# Patient Record
Sex: Female | Born: 1975 | Race: Black or African American | Hispanic: Refuse to answer | Marital: Single
Health system: Midwestern US, Community
[De-identification: ages and names within clinical notes are randomized; demographics above are authoritative.]

## PROBLEM LIST (undated history)

## (undated) DIAGNOSIS — R059 Cough, unspecified: Secondary | ICD-10-CM

## (undated) DIAGNOSIS — J45909 Unspecified asthma, uncomplicated: Secondary | ICD-10-CM

## (undated) DIAGNOSIS — K219 Gastro-esophageal reflux disease without esophagitis: Secondary | ICD-10-CM

## (undated) DIAGNOSIS — F329 Major depressive disorder, single episode, unspecified: Secondary | ICD-10-CM

## (undated) DIAGNOSIS — I890 Lymphedema, not elsewhere classified: Secondary | ICD-10-CM

## (undated) DIAGNOSIS — F909 Attention-deficit hyperactivity disorder, unspecified type: Secondary | ICD-10-CM

## (undated) DIAGNOSIS — D649 Anemia, unspecified: Secondary | ICD-10-CM

## (undated) DIAGNOSIS — E538 Deficiency of other specified B group vitamins: Secondary | ICD-10-CM

## (undated) DIAGNOSIS — F32A Depression, unspecified: Secondary | ICD-10-CM

## (undated) DIAGNOSIS — E739 Lactose intolerance, unspecified: Secondary | ICD-10-CM

## (undated) DIAGNOSIS — E559 Vitamin D deficiency, unspecified: Secondary | ICD-10-CM

## (undated) DIAGNOSIS — Z91018 Allergy to other foods: Secondary | ICD-10-CM

## (undated) DIAGNOSIS — G4730 Sleep apnea, unspecified: Secondary | ICD-10-CM

## (undated) DIAGNOSIS — F419 Anxiety disorder, unspecified: Secondary | ICD-10-CM

## (undated) DIAGNOSIS — L309 Dermatitis, unspecified: Secondary | ICD-10-CM

## (undated) DIAGNOSIS — N159 Renal tubulo-interstitial disease, unspecified: Secondary | ICD-10-CM

## (undated) DIAGNOSIS — M2550 Pain in unspecified joint: Secondary | ICD-10-CM

## (undated) HISTORY — DX: Anemia, unspecified: D64.9

## (undated) HISTORY — PX: TONSILLECTOMY: SUR1361

## (undated) HISTORY — DX: Sleep apnea, unspecified: G47.30

## (undated) HISTORY — DX: Allergy to other foods: Z91.018

## (undated) HISTORY — DX: Vitamin D deficiency, unspecified: E55.9

## (undated) HISTORY — DX: Deficiency of other specified B group vitamins: E53.8

## (undated) HISTORY — DX: Anxiety disorder, unspecified: F41.9

## (undated) HISTORY — DX: Major depressive disorder, single episode, unspecified: F32.9

## (undated) HISTORY — DX: Unspecified asthma, uncomplicated: J45.909

## (undated) HISTORY — DX: Depression, unspecified: F32.A

## (undated) HISTORY — DX: Lactose intolerance, unspecified: E73.9

## (undated) HISTORY — DX: Pain in unspecified joint: M25.50

## (undated) HISTORY — DX: Attention-deficit hyperactivity disorder, unspecified type: F90.9

## (undated) HISTORY — DX: Dermatitis, unspecified: L30.9

## (undated) HISTORY — DX: Lymphedema, not elsewhere classified: I89.0

## (undated) HISTORY — DX: Gastro-esophageal reflux disease without esophagitis: K21.9

---

## 1997-09-09 ENCOUNTER — Emergency Department (HOSPITAL_COMMUNITY): Admission: EM | Admit: 1997-09-09 | Discharge: 1997-09-09 | Payer: Self-pay | Admitting: Emergency Medicine

## 1997-11-03 ENCOUNTER — Emergency Department (HOSPITAL_COMMUNITY): Admission: EM | Admit: 1997-11-03 | Discharge: 1997-11-03 | Payer: Self-pay | Admitting: Internal Medicine

## 1998-04-22 ENCOUNTER — Other Ambulatory Visit: Admission: RE | Admit: 1998-04-22 | Discharge: 1998-04-22 | Payer: Self-pay | Admitting: *Deleted

## 1999-01-02 ENCOUNTER — Emergency Department (HOSPITAL_COMMUNITY): Admission: EM | Admit: 1999-01-02 | Discharge: 1999-01-02 | Payer: Self-pay | Admitting: Emergency Medicine

## 1999-01-03 ENCOUNTER — Inpatient Hospital Stay (HOSPITAL_COMMUNITY): Admission: AD | Admit: 1999-01-03 | Discharge: 1999-01-03 | Payer: Self-pay | Admitting: Obstetrics and Gynecology

## 2001-04-09 ENCOUNTER — Emergency Department (HOSPITAL_COMMUNITY): Admission: EM | Admit: 2001-04-09 | Discharge: 2001-04-09 | Payer: Self-pay | Admitting: Emergency Medicine

## 2001-04-21 ENCOUNTER — Ambulatory Visit (HOSPITAL_BASED_OUTPATIENT_CLINIC_OR_DEPARTMENT_OTHER): Admission: RE | Admit: 2001-04-21 | Discharge: 2001-04-21 | Payer: Self-pay | Admitting: Pediatrics

## 2007-08-27 ENCOUNTER — Ambulatory Visit: Payer: Self-pay | Admitting: *Deleted

## 2007-08-27 ENCOUNTER — Inpatient Hospital Stay (HOSPITAL_COMMUNITY): Admission: AD | Admit: 2007-08-27 | Discharge: 2007-08-27 | Payer: Self-pay | Admitting: Obstetrics & Gynecology

## 2007-09-07 ENCOUNTER — Ambulatory Visit: Payer: Self-pay | Admitting: Obstetrics & Gynecology

## 2007-09-21 ENCOUNTER — Ambulatory Visit: Payer: Self-pay | Admitting: Obstetrics & Gynecology

## 2007-10-05 ENCOUNTER — Ambulatory Visit: Payer: Self-pay | Admitting: Obstetrics & Gynecology

## 2007-10-12 ENCOUNTER — Ambulatory Visit: Payer: Self-pay | Admitting: Obstetrics & Gynecology

## 2007-10-18 ENCOUNTER — Inpatient Hospital Stay (HOSPITAL_COMMUNITY): Admission: AD | Admit: 2007-10-18 | Discharge: 2007-10-18 | Payer: Self-pay | Admitting: Obstetrics & Gynecology

## 2007-10-18 ENCOUNTER — Ambulatory Visit: Payer: Self-pay | Admitting: Advanced Practice Midwife

## 2007-10-26 ENCOUNTER — Ambulatory Visit: Payer: Self-pay | Admitting: Obstetrics & Gynecology

## 2007-10-29 ENCOUNTER — Inpatient Hospital Stay (HOSPITAL_COMMUNITY): Admission: AD | Admit: 2007-10-29 | Discharge: 2007-10-31 | Payer: Self-pay | Admitting: Obstetrics and Gynecology

## 2007-10-29 ENCOUNTER — Ambulatory Visit: Payer: Self-pay | Admitting: Obstetrics & Gynecology

## 2007-12-28 ENCOUNTER — Other Ambulatory Visit: Admission: RE | Admit: 2007-12-28 | Discharge: 2007-12-28 | Payer: Self-pay | Admitting: Family Medicine

## 2009-01-16 ENCOUNTER — Emergency Department (HOSPITAL_COMMUNITY): Admission: EM | Admit: 2009-01-16 | Discharge: 2009-01-16 | Payer: Self-pay | Admitting: Emergency Medicine

## 2009-01-18 ENCOUNTER — Ambulatory Visit (HOSPITAL_COMMUNITY): Admission: AD | Admit: 2009-01-18 | Discharge: 2009-01-18 | Payer: Self-pay | Admitting: Obstetrics and Gynecology

## 2009-11-16 IMAGING — US US OB COMP +14 WK
1 series · 14 of 27 positions shown · non-contrast
Comparison: none

OBSTETRICAL ULTRASOUND:
 This ultrasound exam was performed in the [HOSPITAL] Ultrasound Department.  The OB US report was generated in the AS system, and faxed to the ordering physician.  This report is also available in [REDACTED] PACS.

[Series 1: us ob comp +14 wk · 14 of 27 slices shown]
[im 1/27]
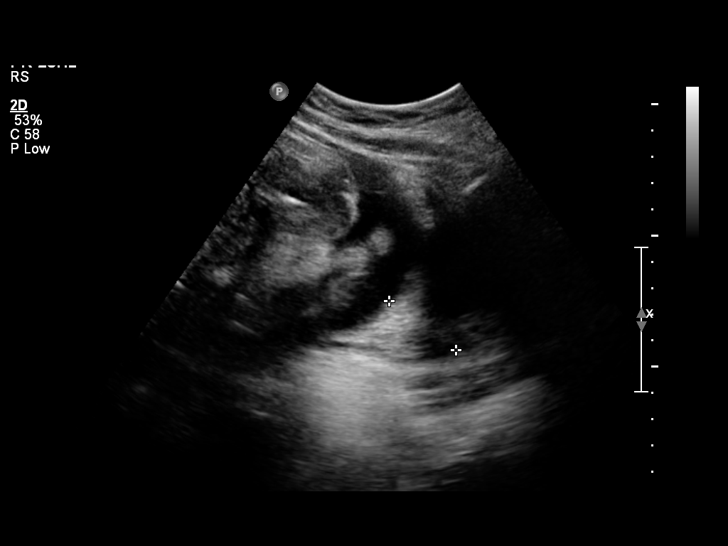
[im 3/27]
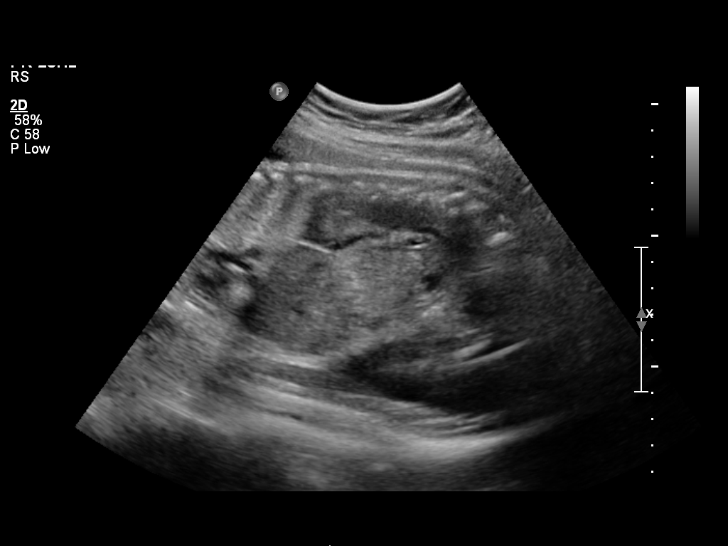
[im 5/27]
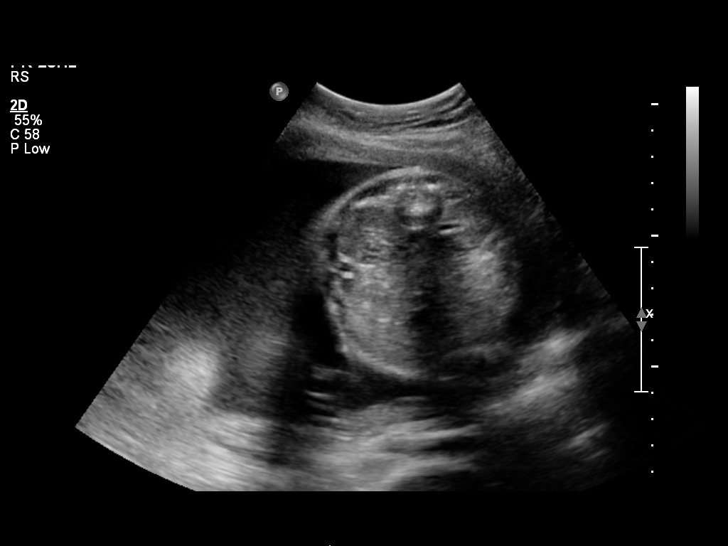
[im 7/27]
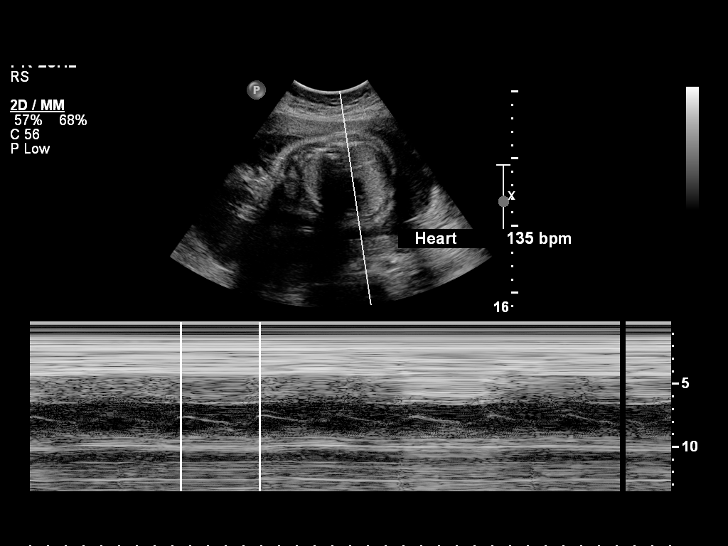
[im 9/27]
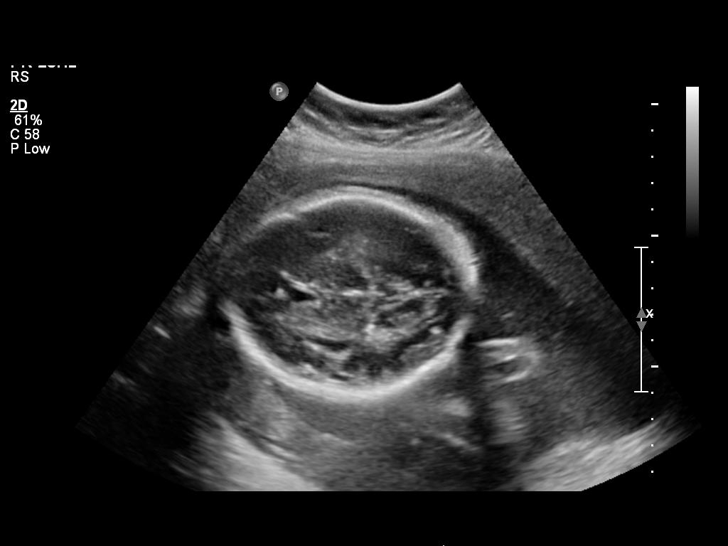
[im 11/27]
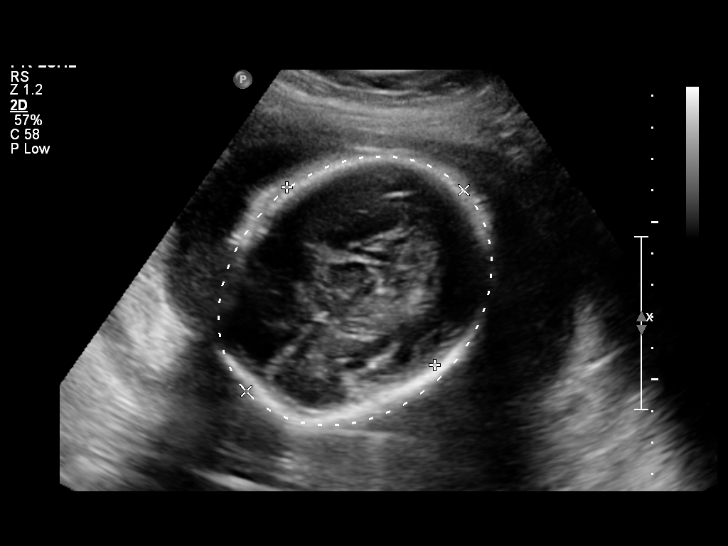
[im 13/27]
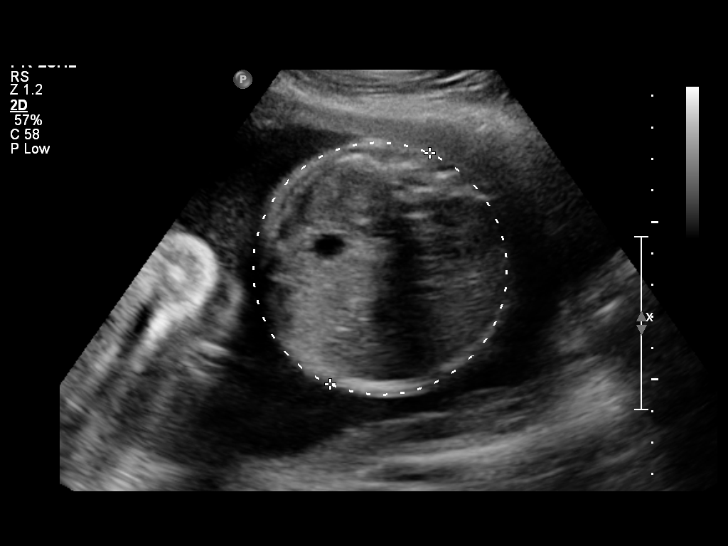
[im 15/27]
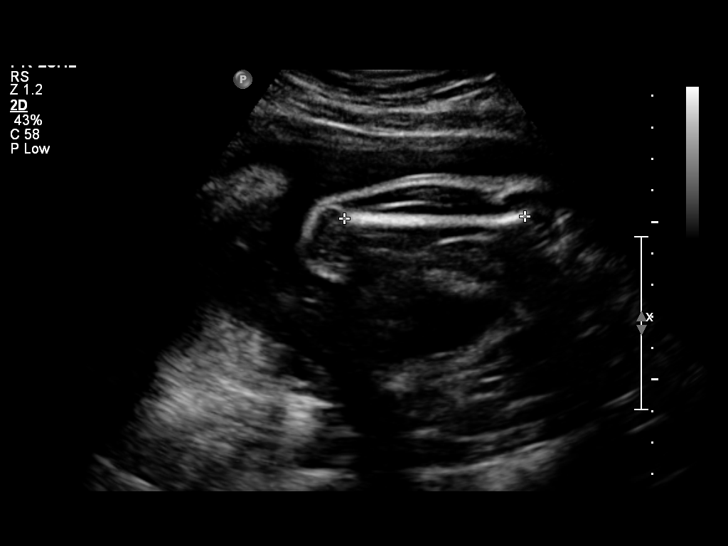
[im 17/27]
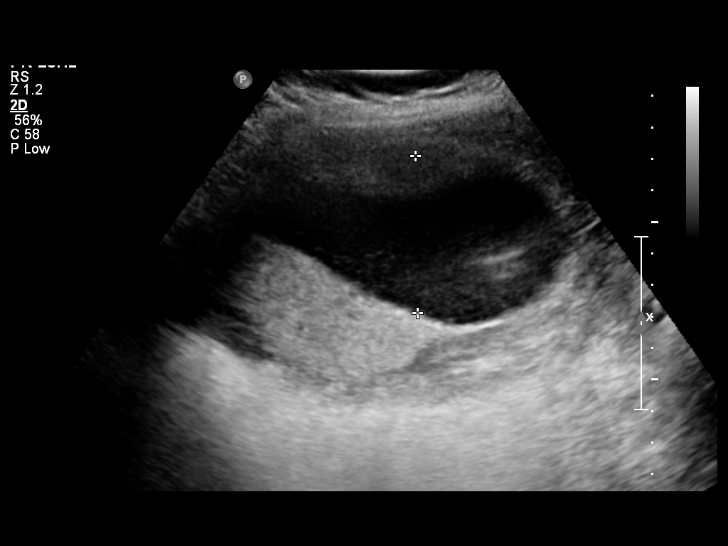
[im 19/27]
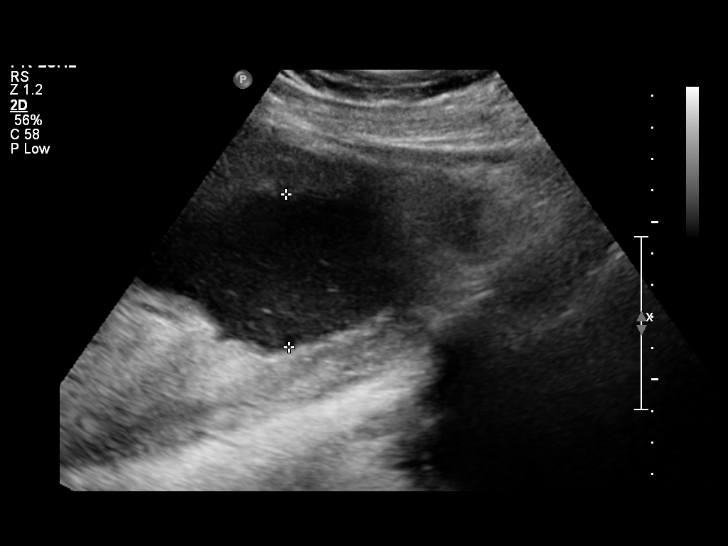
[im 21/27]
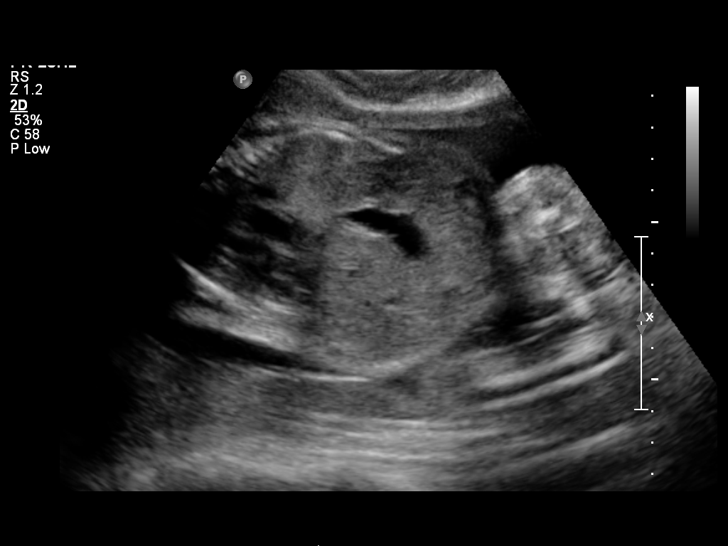
[im 23/27]
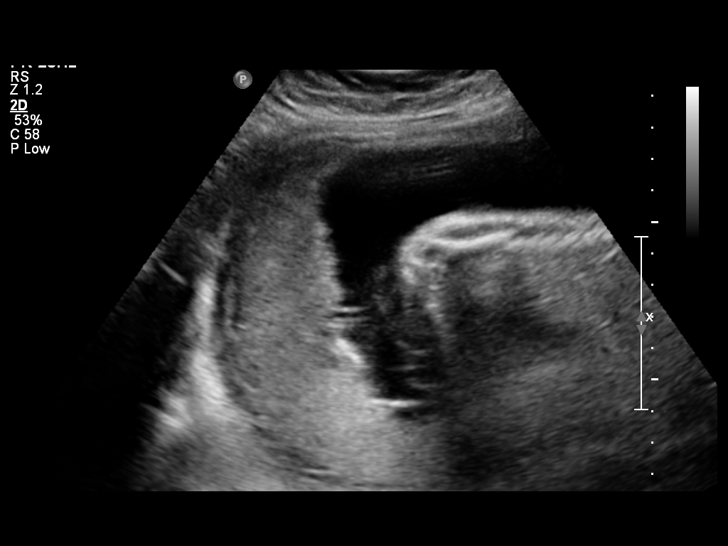
[im 25/27]
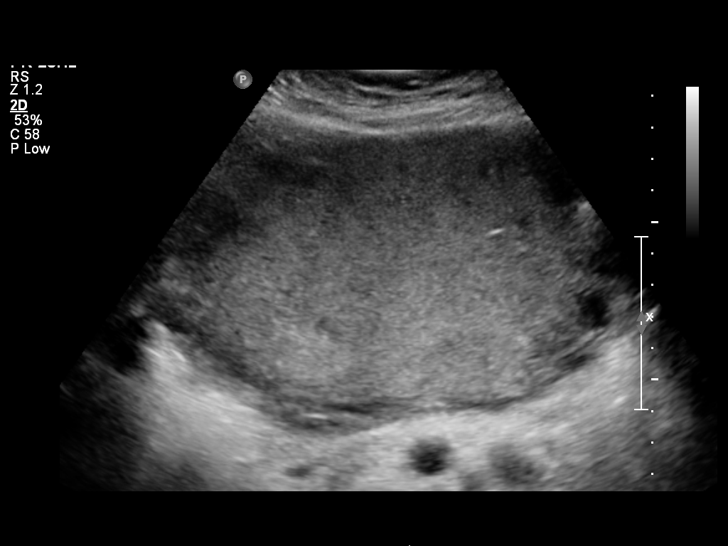
[im 27/27]
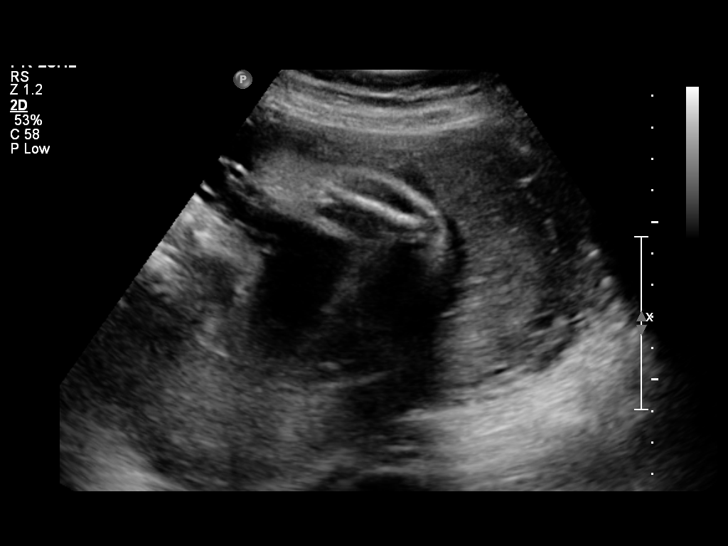

[14 of 27 positions shown; findings below may reference images not displayed]

IMPRESSION: See AS Obstetric US report.

## 2010-05-20 LAB — PROTIME-INR: Prothrombin Time: 13.9 seconds (ref 11.6–15.2)

## 2010-05-20 LAB — DIFFERENTIAL
Lymphocytes Relative: 11 % — ABNORMAL LOW (ref 12–46)
Monocytes Absolute: 0.4 10*3/uL (ref 0.1–1.0)
Monocytes Relative: 3 % (ref 3–12)
Neutro Abs: 10.5 10*3/uL — ABNORMAL HIGH (ref 1.7–7.7)

## 2010-05-20 LAB — TYPE AND SCREEN: ABO/RH(D): A POS

## 2010-05-20 LAB — COMPREHENSIVE METABOLIC PANEL
ALT: 15 U/L (ref 0–35)
Alkaline Phosphatase: 70 U/L (ref 39–117)
CO2: 26 mEq/L (ref 19–32)
Chloride: 105 mEq/L (ref 96–112)
GFR calc non Af Amer: >60 mL/min (ref >60)
Glucose, Bld: 107 mg/dL — ABNORMAL HIGH (ref 70–99)
Potassium: 3.9 mEq/L (ref 3.5–5.1)
Sodium: 136 mEq/L (ref 135–145)

## 2010-05-20 LAB — CBC
HCT: 36.4 % (ref 36.0–46.0)
Hemoglobin: 12.2 g/dL (ref 12.0–15.0)
Platelets: 332 10*3/uL (ref 150–400)
RBC: 4.43 MIL/uL (ref 3.87–5.11)
RBC: 4.59 MIL/uL (ref 3.87–5.11)
WBC: 7.1 10*3/uL (ref 4.0–10.5)

## 2010-05-20 LAB — CROSSMATCH: ABO/RH(D): A POS

## 2010-05-20 LAB — ABO/RH: ABO/RH(D): A POS

## 2010-11-13 LAB — URINALYSIS, ROUTINE W REFLEX MICROSCOPIC
Glucose, UA: NEGATIVE
Protein, ur: NEGATIVE
pH: 7

## 2010-11-13 LAB — GC/CHLAMYDIA PROBE AMP, GENITAL: GC Probe Amp, Genital: NEGATIVE

## 2010-11-13 LAB — WET PREP, GENITAL: Trich, Wet Prep: NONE SEEN

## 2010-11-14 LAB — POCT URINALYSIS DIP (DEVICE)
Bilirubin Urine: NEGATIVE
Glucose, UA: NEGATIVE
Glucose, UA: NEGATIVE
Hgb urine dipstick: NEGATIVE
Hgb urine dipstick: NEGATIVE
Ketones, ur: NEGATIVE
Specific Gravity, Urine: 1.015
Specific Gravity, Urine: 1.025
Urobilinogen, UA: 0.2

## 2010-11-19 LAB — POCT URINALYSIS DIP (DEVICE)
Glucose, UA: NEGATIVE
Nitrite: NEGATIVE
Operator id: 297281
Protein, ur: 30 — AB
Urobilinogen, UA: 0.2

## 2010-11-19 LAB — WET PREP, GENITAL
Clue Cells Wet Prep HPF POC: NONE SEEN
Trich, Wet Prep: NONE SEEN

## 2010-11-19 LAB — RPR: RPR Ser Ql: NONREACTIVE

## 2010-11-19 LAB — URINALYSIS, ROUTINE W REFLEX MICROSCOPIC
Ketones, ur: NEGATIVE
Nitrite: NEGATIVE
Specific Gravity, Urine: 1.015
Urobilinogen, UA: 0.2
pH: 8.5 — ABNORMAL HIGH

## 2010-11-19 LAB — URINE MICROSCOPIC-ADD ON

## 2010-11-19 LAB — CBC
Platelets: 275
RBC: 4.18
WBC: 6.9

## 2011-09-28 ENCOUNTER — Encounter (HOSPITAL_COMMUNITY): Payer: Self-pay | Admitting: *Deleted

## 2011-09-28 ENCOUNTER — Emergency Department (HOSPITAL_COMMUNITY)
Admission: EM | Admit: 2011-09-28 | Discharge: 2011-09-28 | Disposition: A | Discharge status: Home / Self Care | Payer: Self-pay | Attending: Emergency Medicine | Admitting: Emergency Medicine

## 2011-09-28 DIAGNOSIS — J029 Acute pharyngitis, unspecified: Secondary | ICD-10-CM | POA: Insufficient documentation

## 2011-09-28 DIAGNOSIS — N10 Acute tubulo-interstitial nephritis: Secondary | ICD-10-CM | POA: Insufficient documentation

## 2011-09-28 HISTORY — DX: Renal tubulo-interstitial disease, unspecified: N15.9

## 2011-09-28 LAB — CBC WITH DIFFERENTIAL/PLATELET
Eosinophils Absolute: 0 10*3/uL (ref 0.0–0.7)
Eosinophils Relative: 0 % (ref 0–5)
HCT: 38.7 % (ref 36.0–46.0)
Hemoglobin: 12.6 g/dL (ref 12.0–15.0)
Lymphocytes Relative: 9 % — ABNORMAL LOW (ref 12–46)
Lymphs Abs: 1.2 10*3/uL (ref 0.7–4.0)
MCHC: 32.6 g/dL (ref 30.0–36.0)
Monocytes Absolute: 0.9 10*3/uL (ref 0.1–1.0)
Neutro Abs: 11.3 10*3/uL — ABNORMAL HIGH (ref 1.7–7.7)
Neutrophils Relative %: 84 % — ABNORMAL HIGH (ref 43–77)
WBC: 13.4 10*3/uL — ABNORMAL HIGH (ref 4.0–10.5)

## 2011-09-28 LAB — POCT I-STAT, CHEM 8
Calcium, Ion: 1.1 mmol/L — ABNORMAL LOW (ref 1.12–1.23)
Creatinine, Ser: 0.9 mg/dL (ref 0.50–1.10)
Hemoglobin: 13.6 g/dL (ref 12.0–15.0)
Sodium: 136 mEq/L (ref 135–145)
TCO2: 19 mmol/L (ref 0–100)

## 2011-09-28 LAB — URINALYSIS, ROUTINE W REFLEX MICROSCOPIC
Ketones, ur: NEGATIVE mg/dL
Nitrite: NEGATIVE
Protein, ur: NEGATIVE mg/dL
pH: 6 (ref 5.0–8.0)

## 2011-09-28 LAB — RAPID STREP SCREEN (MED CTR MEBANE ONLY): Streptococcus, Group A Screen (Direct): NEGATIVE

## 2011-09-28 LAB — URINE MICROSCOPIC-ADD ON

## 2011-09-28 LAB — POCT PREGNANCY, URINE: Preg Test, Ur: NEGATIVE

## 2011-09-28 MED ORDER — CEPHALEXIN 500 MG PO CAPS: 500.0000 mg | ORAL_CAPSULE | Freq: Four times a day (QID) | ORAL | Status: DC

## 2011-09-28 MED ORDER — ONDANSETRON 4 MG PO TBDP: 4.0000 mg | ORAL_TABLET | Freq: Three times a day (TID) | ORAL | Status: DC | PRN

## 2011-09-28 MED ORDER — TRAMADOL HCL 50 MG PO TABS: 50.0000 mg | ORAL_TABLET | Freq: Four times a day (QID) | ORAL | Status: DC | PRN

## 2011-09-28 MED ORDER — MORPHINE SULFATE 4 MG/ML IJ SOLN
4.0000 mg | Freq: Once | INTRAMUSCULAR | Status: AC
  Administered 2011-09-28: 4 mg via INTRAVENOUS
  Filled 2011-09-28: qty 1

## 2011-09-28 MED ORDER — SODIUM CHLORIDE 0.9 % IV BOLUS (SEPSIS)
1000.0000 mL | Freq: Once | INTRAVENOUS | Status: AC
  Administered 2011-09-28: 1000 mL via INTRAVENOUS

## 2011-09-28 MED ORDER — ONDANSETRON HCL 4 MG/2ML IJ SOLN
4.0000 mg | INTRAMUSCULAR | Status: AC
  Administered 2011-09-28: 4 mg via INTRAVENOUS
  Filled 2011-09-28: qty 2

## 2011-09-28 NOTE — ED Notes (Signed)
Pt reports flank pain on her right side. Pain radiates up her back and occasionally shoots down her leg.  Reports pain as "achy." Pt has a hx of polynephritis and states her current Sx are similar.  Pt is having pain and burning sensation upon urination. Urine is yellow but cloudy.  Endorses nausea and vomited x1 this am. Currently rates pain a 8/10.

## 2011-09-28 NOTE — ED Notes (Signed)
PA at bedside.

## 2011-09-28 NOTE — ED Notes (Signed)
Pt c/o urinary frequency, low back pain and pelvic pain starting Sat. Pt states nausea and headache w/ fever starting Sunday.

## 2011-09-28 NOTE — ED Provider Notes (Signed)
History     CSN: [id number]  Arrival date & time 09/28/11  0507   First MD Initiated Contact with Patient 09/28/11 [phone]      Chief Complaint  Patient presents with  . Urinary Frequency  . Back Pain  . Pelvic Pain  . Fever    (Consider location/radiation/quality/duration/timing/severity/associated sxs/prior treatment) HPI  36 y/o female INAD c/o dysuria and frequency x2 weeks. She has been taking AZO with moderate relief. She now has bilateral flank pain a(8/10) and low back pain x3days, subjective fever/chills x1 day with single episode of vommiting this AM. Tolerating PO liquids. Pt also reports a sore throat x2 days and HA. States she has not eaten solids in 2 days because of the sore throat and nausea equally. Denies change in bowel habits.   Past Medical History  Diagnosis Date  . Kidney infection     History reviewed. No pertinent past surgical history.  History reviewed. No pertinent family history.  History  Substance Use Topics  . Smoking status: Never Smoker   . Smokeless tobacco: Not on file  . Alcohol Use: No    OB History    Grav Para Term Preterm Abortions TAB SAB Ect Mult Living                  Review of Systems  Constitutional: Positive for fever.  HENT: Positive for sore throat.   Gastrointestinal: Positive for nausea and vomiting. Negative for abdominal pain and diarrhea.  Genitourinary: Positive for dysuria, frequency and flank pain.  Musculoskeletal: Positive for back pain.  All other systems reviewed and are negative.    Allergies  Review of patient's allergies indicates no known allergies.  Home Medications  No current outpatient prescriptions on file.  BP 111/51  Pulse 113  Temp 99.3 F (37.4 C) (Oral)  Resp 20  SpO2 99%  Physical Exam  Nursing note and vitals reviewed. Constitutional: She is oriented to person, place, and time. She appears well-developed and well-nourished. No distress.  HENT:  Head: Normocephalic.  Right  Ear: External ear normal.  Left Ear: External ear normal.  Mouth/Throat: Oropharynx is clear and moist. No oropharyngeal exudate.  Eyes: Conjunctivae and EOM are normal. Pupils are equal, round, and reactive to light.  Neck: Normal range of motion. Neck supple.       Tender anterior cervical LAD  Cardiovascular: Normal rate and regular rhythm.   Pulmonary/Chest: Effort normal and breath sounds normal. No respiratory distress. She has no wheezes. She has no rales.  Abdominal: Soft. Bowel sounds are normal. She exhibits no distension and no mass. There is no tenderness. There is no rebound and no guarding.  Genitourinary:       Bilateral flank pain  Musculoskeletal: Normal range of motion.  Lymphadenopathy:    She has cervical adenopathy.  Neurological: She is alert and oriented to person, place, and time.  Skin: Skin is warm.  Psychiatric: She has a normal mood and affect.    ED Course  Procedures (including critical care time)  Labs Reviewed  URINALYSIS, ROUTINE W REFLEX MICROSCOPIC - Abnormal; Notable for the following:    APPearance CLOUDY (*)     Hgb urine dipstick MODERATE (*)     Leukocytes, UA LARGE (*)     All other components within normal limits  CBC WITH DIFFERENTIAL - Abnormal; Notable for the following:    WBC 13.4 (*)     Neutrophils Relative 84 (*)     Neutro Abs 11.3 (*)  Lymphocytes Relative 9 (*)     All other components within normal limits  POCT I-STAT, CHEM 8 - Abnormal; Notable for the following:    Potassium 3.4 (*)     Calcium, Ion 1.10 (*)     All other components within normal limits  URINE MICROSCOPIC-ADD ON - Abnormal; Notable for the following:    Squamous Epithelial / LPF MANY (*)     Bacteria, UA MANY (*)     All other components within normal limits   No results found.   1. Pyelonephritis, acute   2. Pharyngitis, acute       MDM  Clinical pyelonephritis. She is tachycardic likely from dehydration which is likely contributing to  her HA, I will bolus 1L NS and give 1g rocephin IV. I will test her for strep. White count likely secondary to UTIs and pyelonephritis.   UA is contaminated I will obtain a clean catch for culture.   Pt is tolerating PO, I will d/c her on keflex, Zofran and tramadol. Patient has primary care follow-up.  Patient is still mildly tachycardic but able to tolerate by mouth himself hydrated.  I advised patient not to take AZO for longer than 48 hours at a time and to present earlier for urinary tract infection as she is prone to ascending infection into her kidneys. Patient voiced understanding.      Wynetta Emery, PA-C 09/28/11 [phone]

## 2011-09-28 NOTE — ED Provider Notes (Signed)
Medical screening examination/treatment/procedure(s) were performed by non-physician practitioner and as supervising physician I was immediately available for consultation/collaboration.   Wylie Russon M Rayson Rando, MD 09/28/11 1722 

## 2011-09-29 LAB — URINE CULTURE

## 2011-09-29 LAB — STREP A DNA PROBE: Group A Strep Probe: NEGATIVE

## 2011-09-30 ENCOUNTER — Emergency Department (HOSPITAL_COMMUNITY): Payer: Self-pay

## 2011-09-30 ENCOUNTER — Encounter (HOSPITAL_COMMUNITY): Payer: Self-pay | Admitting: Emergency Medicine

## 2011-09-30 ENCOUNTER — Emergency Department (HOSPITAL_COMMUNITY)
Admission: EM | Admit: 2011-09-30 | Discharge: 2011-09-30 | Disposition: A | Discharge status: Home / Self Care | Payer: Self-pay | Attending: Emergency Medicine | Admitting: Emergency Medicine

## 2011-09-30 DIAGNOSIS — J45901 Unspecified asthma with (acute) exacerbation: Secondary | ICD-10-CM | POA: Insufficient documentation

## 2011-09-30 DIAGNOSIS — R42 Dizziness and giddiness: Secondary | ICD-10-CM | POA: Insufficient documentation

## 2011-09-30 LAB — CBC WITH DIFFERENTIAL/PLATELET
HCT: 36 % (ref 36.0–46.0)
Hemoglobin: 11.9 g/dL — ABNORMAL LOW (ref 12.0–15.0)
Lymphocytes Relative: 25 % (ref 12–46)
Monocytes Absolute: 0.5 10*3/uL (ref 0.1–1.0)
Monocytes Relative: 8 % (ref 3–12)
Neutro Abs: 3.7 10*3/uL (ref 1.7–7.7)
Neutrophils Relative %: 63 % (ref 43–77)
RBC: 4.47 MIL/uL (ref 3.87–5.11)
WBC: 5.9 10*3/uL (ref 4.0–10.5)

## 2011-09-30 LAB — COMPREHENSIVE METABOLIC PANEL
AST: 16 U/L (ref 0–37)
Albumin: 3.4 g/dL — ABNORMAL LOW (ref 3.5–5.2)
Alkaline Phosphatase: 80 U/L (ref 39–117)
BUN: 8 mg/dL (ref 6–23)
CO2: 24 mEq/L (ref 19–32)
Chloride: 103 mEq/L (ref 96–112)
Creatinine, Ser: 0.67 mg/dL (ref 0.50–1.10)
GFR calc non Af Amer: >90 mL/min (ref >90)
Potassium: 3.8 mEq/L (ref 3.5–5.1)
Total Bilirubin: 0.2 mg/dL — ABNORMAL LOW (ref 0.3–1.2)

## 2011-09-30 MED ORDER — ALBUTEROL SULFATE (5 MG/ML) 0.5% IN NEBU
5.0000 mg | INHALATION_SOLUTION | Freq: Once | RESPIRATORY_TRACT | Status: AC
  Administered 2011-09-30: 5 mg via RESPIRATORY_TRACT
  Filled 2011-09-30: qty 1

## 2011-09-30 MED ORDER — PREDNISONE 20 MG PO TABS
60.0000 mg | ORAL_TABLET | Freq: Once | ORAL | Status: AC
  Administered 2011-09-30: 60 mg via ORAL
  Filled 2011-09-30: qty 3

## 2011-09-30 MED ORDER — PREDNISONE 10 MG PO TABS: 40.0000 mg | ORAL_TABLET | Freq: Every day | ORAL | Status: DC

## 2011-09-30 MED ORDER — CIPROFLOXACIN HCL 500 MG PO TABS: 500.0000 mg | ORAL_TABLET | Freq: Two times a day (BID) | ORAL | Status: AC

## 2011-09-30 MED ORDER — ALBUTEROL SULFATE HFA 108 (90 BASE) MCG/ACT IN AERS
2.0000 | INHALATION_SPRAY | RESPIRATORY_TRACT | Status: DC | PRN
  Administered 2011-09-30: 2 via RESPIRATORY_TRACT
  Filled 2011-09-30: qty 6.7

## 2011-09-30 NOTE — ED Provider Notes (Signed)
History     CSN: [id number]  Arrival date & time 09/30/11  1132   First MD Initiated Contact with Patient 09/30/11 1200      Chief Complaint  Patient presents with  . Asthma  . Dizziness    (Consider location/radiation/quality/duration/timing/severity/associated sxs/prior treatment) HPI  Patient presents to the emergency department with complaints of asthma exacerbation. She states that she has chronic asthma which has been hard to control. She has been out of her albuterol inhaler and has been using her Advair inhaler instead to control her asthma. Patient is speaking to me in full sentences and does not appear to be in any distress. She states that she was here on Monday and diagnosed with pyelonephritis and that she was given Keflex antibiotics for treatment and she feels that they're making her dizzy and to shake a little bit. She states that she is not having any abdominal pain, fevers, chills anymore. The only problem she is here for is her asthma. She states this started after a family reunion last Saturday. She states that she has a lot of allergies and eczema as well and her asthma has been difficult to control. VSS and pt in NAD.  Past Medical History  Diagnosis Date  . Kidney infection     History reviewed. No pertinent past surgical history.  No family history on file.  History  Substance Use Topics  . Smoking status: Never Smoker   . Smokeless tobacco: Not on file  . Alcohol Use: No    OB History    Grav Para Term Preterm Abortions TAB SAB Ect Mult Living                  Review of Systems   HEENT: denies blurry vision or change in hearing PULMONARY: + wheezing, - SOB CARDIAC: denies chest pain or heart palpitations MUSCULOSKELETAL:  denies being unable to ambulate ABDOMEN AL: denies abdominal pain GU: denies loss of bowel or urinary control NEURO: denies numbness and tingling in extremities SKIN: no new rashes PSYCH: patient denies anxiety or  depression. NECK: Pt denies having neck pain     Allergies  Review of patient's allergies indicates no known allergies.  Home Medications   Current Outpatient Rx  Name Route Sig Dispense Refill  . CEPHALEXIN 500 MG PO CAPS Oral Take 500 mg by mouth 4 (four) times daily.    . IBUPROFEN 200 MG PO TABS Oral Take 400 mg by mouth every 6 (six) hours as needed. Fever/pain    . ONDANSETRON 4 MG PO TBDP Oral Take 4 mg by mouth every 8 (eight) hours as needed. nausea    . TRAMADOL HCL 50 MG PO TABS Oral Take 50 mg by mouth every 6 (six) hours as needed.    Marland Kitchen PREDNISONE 10 MG PO TABS Oral Take 4 tablets (40 mg total) by mouth daily. 14 tablet 0    BP 121/72  Pulse 92  Temp 98.8 F (37.1 C) (Oral)  Resp 18  SpO2 100%  Physical Exam  Nursing note and vitals reviewed. Constitutional: She appears well-developed and well-nourished. No distress.  HENT:  Head: Normocephalic and atraumatic.  Nose: Rhinorrhea present.  Eyes: Pupils are equal, round, and reactive to light.  Neck: Normal range of motion. Neck supple.  Cardiovascular: Normal rate, regular rhythm and normal heart sounds.   No murmur heard. Pulmonary/Chest: Effort normal. No respiratory distress. She has wheezes. She has no rales. She exhibits no tenderness.  Abdominal: Soft. There is  no tenderness.  Neurological: She is alert.  Skin: Skin is warm and dry.    ED Course  Procedures (including critical care time)  Labs Reviewed  CBC WITH DIFFERENTIAL - Abnormal; Notable for the following:    Hemoglobin 11.9 (*)     All other components within normal limits  COMPREHENSIVE METABOLIC PANEL - Abnormal; Notable for the following:    Albumin 3.4 (*)     Total Bilirubin 0.2 (*)     All other components within normal limits   Dg Chest 2 View  09/30/2011  *RADIOLOGY REPORT*  Clinical Data: Asthma, dizziness  CHEST - 2 VIEW  Comparison: None.  Findings: Cardiomediastinal silhouette is unremarkable.  No acute infiltrate or pleural  effusion.  No pulmonary edema.  Bony thorax is unremarkable.  IMPRESSION: No active disease.  Original Report Authenticated By: Natasha Mead, M.D.     1. Asthma exacerbation       MDM   Date: 09/30/2011  Rate: 71  Rhythm: normal sinus rhythm  QRS Axis: normal  Intervals: normal  ST/T Wave abnormalities: normal  Conduction Disutrbances:none  Narrative Interpretation:   Old EKG Reviewed: none available   Pt seen here this past Monday and diagnosed with Pyelonephritis. She was placed on keflex which she feels like makes her sick. I have changed abx for Cipro and asked her to continue to follow-up with her PCP. She needs better control of her chronic asthma. I have given her a short dose of prednisone and albuterol inhaler in the ED.  Pts attack not severe, her sats remained > 97% throughout her whole stay in the ED. No respiratory distress. No wheezing after breathing treatment with good airway movement.  Pt has been advised of the symptoms that warrant their return to the ED. Patient has voiced understanding and has agreed to follow-up with the PCP or specialist.         Dorthula Matas, PA 09/30/11 1353

## 2011-09-30 NOTE — ED Notes (Signed)
RT notified regarding pt neb medication order.

## 2011-09-30 NOTE — ED Provider Notes (Signed)
Medical screening examination/treatment/procedure(s) were performed by non-physician practitioner and as supervising physician I was immediately available for consultation/collaboration.  Larysa Pall R. Marguerette Sheller, MD 09/30/11 1552 

## 2011-09-30 NOTE — ED Notes (Addendum)
Pt presenting to ed with c/o shortness of breath and dizziness pt states onset x 2 days pt states she is taking antibiotics that she thought was causing the dizziness. Pt states positive congestion and cough

## 2011-09-30 NOTE — Progress Notes (Signed)
[facility] ED CM noted pt self pay without pcp.  Referral to [facility] coordinator.  [facility] coordinator saw pt who states she has [employer] and is relocating from [place]

## 2012-03-17 NOTE — Telephone Encounter (Signed)
Several attempt have been made to schedule patient for attended sleep study. Referring provider will be notified.

## 2012-07-30 ENCOUNTER — Encounter (HOSPITAL_COMMUNITY): Payer: Self-pay

## 2012-07-30 ENCOUNTER — Emergency Department (HOSPITAL_COMMUNITY)
Admission: EM | Admit: 2012-07-30 | Discharge: 2012-07-30 | Disposition: A | Discharge status: Home / Self Care | Payer: Commercial Managed Care - PPO | Attending: Emergency Medicine | Admitting: Emergency Medicine

## 2012-07-30 ENCOUNTER — Emergency Department (HOSPITAL_COMMUNITY): Payer: Commercial Managed Care - PPO

## 2012-07-30 DIAGNOSIS — T148XXA Other injury of unspecified body region, initial encounter: Secondary | ICD-10-CM

## 2012-07-30 DIAGNOSIS — S46909A Unspecified injury of unspecified muscle, fascia and tendon at shoulder and upper arm level, unspecified arm, initial encounter: Secondary | ICD-10-CM | POA: Insufficient documentation

## 2012-07-30 DIAGNOSIS — IMO0002 Reserved for concepts with insufficient information to code with codable children: Secondary | ICD-10-CM | POA: Insufficient documentation

## 2012-07-30 DIAGNOSIS — S4980XA Other specified injuries of shoulder and upper arm, unspecified arm, initial encounter: Secondary | ICD-10-CM | POA: Insufficient documentation

## 2012-07-30 DIAGNOSIS — Y9239 Other specified sports and athletic area as the place of occurrence of the external cause: Secondary | ICD-10-CM | POA: Insufficient documentation

## 2012-07-30 DIAGNOSIS — Y9389 Activity, other specified: Secondary | ICD-10-CM | POA: Insufficient documentation

## 2012-07-30 DIAGNOSIS — W19XXXA Unspecified fall, initial encounter: Secondary | ICD-10-CM

## 2012-07-30 DIAGNOSIS — S8990XA Unspecified injury of unspecified lower leg, initial encounter: Secondary | ICD-10-CM | POA: Insufficient documentation

## 2012-07-30 DIAGNOSIS — W010XXA Fall on same level from slipping, tripping and stumbling without subsequent striking against object, initial encounter: Secondary | ICD-10-CM | POA: Insufficient documentation

## 2012-07-30 DIAGNOSIS — S99929A Unspecified injury of unspecified foot, initial encounter: Secondary | ICD-10-CM | POA: Insufficient documentation

## 2012-07-30 MED ORDER — METHOCARBAMOL 500 MG PO TABS: 1000.0000 mg | ORAL_TABLET | Freq: Four times a day (QID) | ORAL | Status: DC

## 2012-07-30 MED ORDER — IBUPROFEN 600 MG PO TABS: 600.0000 mg | ORAL_TABLET | Freq: Four times a day (QID) | ORAL | Status: DC | PRN

## 2012-07-30 MED ORDER — IBUPROFEN 200 MG PO TABS
600.0000 mg | ORAL_TABLET | Freq: Once | ORAL | Status: AC
  Administered 2012-07-30: 600 mg via ORAL
  Filled 2012-07-30: qty 3

## 2012-07-30 NOTE — ED Notes (Signed)
Pt fell this morning on some bleacher stairs. Pt is c/o neck pain, back pain, right knee, and right ankle pain.

## 2012-07-30 NOTE — ED Notes (Signed)
When this writer was taking the patient to her room, pt wanted to revisit the questions that I had previously asked her about feeling safe at home. Pt says she is having some issues at home with her mother and would like to talk to someone.

## 2012-07-30 NOTE — ED Provider Notes (Signed)
History    This chart was scribed for Renne Crigler, a non-physician practitioner working with Gavin Pound. Oletta Lamas, MD by Frederik Pear, ED Scribe. This patient was seen in room WTR9/WTR9 and the patient's care was started at 2251.   CSN: [id number]  Arrival date & time 07/30/12  2145   First MD Initiated Contact with Patient 07/30/12 2251      Chief Complaint  Patient presents with  . Fall    (Consider location/radiation/quality/duration/timing/severity/associated sxs/prior treatment) The history is provided by the patient and medical records. No language interpreter was used.    HPI Comments: Kimberly Wells is a 37 y.o. female who presents to the Emergency Department complaining of a fall this morning at graduation at the [employer] when she slipped on the bottom step that was not fully extended. She reports that when she fell she landed on her bottom and denies LOC or hitting her head. She states that she was not able to get up from the fall on her own, and she has had an antalgic gait since the accident. She denies treatment at home. In ED, she complains of constant, worsening, severe right lateral wrist, right shoulder, right upper back, and right lateral ankle pain that radiates intermittently up to her knee that is aggravated by walking and alleviated by nothing. She has no chronic medical conditions that she require daily medications. No allergies to medications.   Past Medical History  Diagnosis Date  . Kidney infection     History reviewed. No pertinent past surgical history.  No family history on file.  History  Substance Use Topics  . Smoking status: Never Smoker   . Smokeless tobacco: Not on file  . Alcohol Use: No    OB History   Grav Para Term Preterm Abortions TAB SAB Ect Mult Living                  Review of Systems  Constitutional: Negative for fever, diaphoresis, appetite change, fatigue and unexpected weight change.  HENT: Negative for mouth  sores and neck stiffness.   Eyes: Negative for visual disturbance.  Respiratory: Negative for cough, chest tightness, shortness of breath and wheezing.   Cardiovascular: Negative for chest pain.  Gastrointestinal: Negative for nausea, vomiting, abdominal pain, diarrhea and constipation.  Endocrine: Negative for polydipsia, polyphagia and polyuria.  Genitourinary: Negative for dysuria, urgency, frequency and hematuria.  Musculoskeletal: Negative for back pain.  Skin: Negative for rash.  Allergic/Immunologic: Negative for immunocompromised state.  Neurological: Negative for syncope, light-headedness and headaches.  Hematological: Does not bruise/bleed easily.  Psychiatric/Behavioral: Negative for sleep disturbance. The patient is not nervous/anxious.     Allergies  Review of patient's allergies indicates no known allergies.  Home Medications   Current Outpatient Rx  Name  Route  Sig  Dispense  Refill  . ibuprofen (ADVIL,MOTRIN) 200 MG tablet   Oral   Take 400 mg by mouth every 6 (six) hours as needed. Fever/pain         . predniSONE (DELTASONE) 10 MG tablet   Oral   Take 4 tablets (40 mg total) by mouth daily.   14 tablet   0     BP 112/77  Pulse 92  Temp(Src) 98.2 F (36.8 C) (Oral)  Resp 16  SpO2 96%  Physical Exam  Nursing note and vitals reviewed. Constitutional: She appears well-developed and well-nourished. No distress.  HENT:  Head: Normocephalic and atraumatic.  Eyes: EOM are normal. Pupils are equal, round, and reactive  to light.  Neck: Normal range of motion. Neck supple. No tracheal deviation present.  Cardiovascular: Normal rate.   Good capillary refill.  Pulmonary/Chest: Effort normal. No respiratory distress.  Abdominal: Soft. She exhibits no distension.  Musculoskeletal: Normal range of motion. She exhibits tenderness. She exhibits no edema.  Tender to palpation to the soft tissues of the right lateral antebrachium and wrist, but no bony tenderness.  Good ROM. Elbow is nontender. Right trapezius is tender to palpation. Right thoracic paraspinal muscle tenderness.  Right lateral malleolus tenderness. Able to ambulate.  Neurological: She is alert.  Sensation is intact.  Skin: Skin is warm and dry.  Psychiatric: She has a normal mood and affect. Her behavior is normal.    ED Course  Procedures (including critical care time)  DIAGNOSTIC STUDIES: Oxygen Saturation is 96% on room air, normal by my interpretation.    COORDINATION OF CARE:  23:25- Discussed planned course of treatment with the patient, including ibuprofen and robaxin, who is agreeable at this time.  23:45- Medication Orders- ibuprofen (advil, motrin) tablet 600 mg- once.  Labs Reviewed - No data to display Dg Ankle Complete Right  07/30/2012   *RADIOLOGY REPORT*  Clinical Data: Pain post fall.  RIGHT ANKLE - COMPLETE 3+ VIEW  Comparison: None.  Findings: Small calcaneal spur at the plantar aponeurosis.  Ankle mortise intact. Negative for fracture, dislocation, or other acute abnormality.  Normal alignment and mineralization. No other significant degenerative change.  Regional soft tissues unremarkable.  IMPRESSION:  Negative   Original Report Authenticated By: D. Andria Rhein, MD     1. Muscle strain   2. Fall, initial encounter    Patient seen and examined. X-ray results reviewed and discussed with patient.  Vital signs reviewed and are as follows: Filed Vitals:   07/30/12 2206  BP: 112/77  Pulse: 92  Temp: 98.2 F (36.8 C)  Resp: 16   Patient counseled on typical course of muscle stiffness and soreness post-fall.  Discussed s/s that should cause them to return.  Patient instructed to take 600mg  ibuprofen no more than every 6 hours x 3 days.  Instructed that prescribed medicine can cause drowsiness and they should not work, drink alcohol, drive while taking this medicine.  Told to return if symptoms do not improve in several days.  Patient verbalized understanding  and agreed with the plan.  D/c to home.     MDM  Patient with muscle stiffness and soreness after a fall. She does walk with a limp but is ambulatory. X-ray of her ankle is negative for fracture. Supportive care indicated.    I personally performed the services described in this documentation, which was scribed in my presence. The recorded information has been reviewed and is accurate.         Renne Crigler, PA-C 07/31/12 [phone]

## 2012-07-30 NOTE — B[facility] Counselor (Signed)
Pt was d/c from [facility] prior to writer being able to see pt and inquire re: pt's possible safety issues with pt's mother.  Evette Cristal, [place] Assessment Counselor

## 2012-07-30 NOTE — ED Notes (Signed)
ACT team is going to come and talk to patient regarding issues that patient is having at home.

## 2012-07-31 NOTE — ED Provider Notes (Signed)
Medical screening examination/treatment/procedure(s) were performed by non-physician practitioner and as supervising physician I was immediately available for consultation/collaboration.   Khian Remo Y. Grethel Zenk, MD 07/31/12 0716 

## 2012-09-29 ENCOUNTER — Other Ambulatory Visit (HOSPITAL_COMMUNITY)
Admission: RE | Admit: 2012-09-29 | Discharge: 2012-09-29 | Disposition: A | Discharge status: Home / Self Care | Payer: Commercial Managed Care - PPO | Source: Ambulatory Visit | Attending: Family Medicine | Admitting: Family Medicine

## 2012-09-29 ENCOUNTER — Other Ambulatory Visit: Payer: Self-pay | Admitting: Family Medicine

## 2012-09-29 DIAGNOSIS — Z113 Encounter for screening for infections with a predominantly sexual mode of transmission: Secondary | ICD-10-CM | POA: Insufficient documentation

## 2012-09-29 DIAGNOSIS — Z1151 Encounter for screening for human papillomavirus (HPV): Secondary | ICD-10-CM | POA: Insufficient documentation

## 2012-09-29 DIAGNOSIS — N760 Acute vaginitis: Secondary | ICD-10-CM | POA: Insufficient documentation

## 2012-09-29 DIAGNOSIS — Z01419 Encounter for gynecological examination (general) (routine) without abnormal findings: Secondary | ICD-10-CM | POA: Insufficient documentation

## 2012-12-13 ENCOUNTER — Ambulatory Visit (HOSPITAL_COMMUNITY)
Admission: RE | Admit: 2012-12-13 | Discharge: 2012-12-13 | Disposition: A | Discharge status: Home / Self Care | Payer: BC Managed Care – PPO | Attending: Psychiatry | Admitting: Psychiatry

## 2012-12-13 NOTE — BH Assessment (Signed)
Writer has scheduled patient to participate in Psych IOP. Kimberly Wells was contacted and scheduled patient to start the program 12/22/2012. She agreed to this plan.
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
## 2012-12-19 ENCOUNTER — Encounter (HOSPITAL_COMMUNITY): Payer: Self-pay

## 2012-12-19 ENCOUNTER — Other Ambulatory Visit (HOSPITAL_COMMUNITY): Payer: BC Managed Care – PPO | Attending: Psychiatry | Admitting: Psychiatry

## 2012-12-19 DIAGNOSIS — F4329 Adjustment disorder with other symptoms: Secondary | ICD-10-CM

## 2012-12-19 DIAGNOSIS — F332 Major depressive disorder, recurrent severe without psychotic features: Secondary | ICD-10-CM | POA: Diagnosis present

## 2012-12-19 DIAGNOSIS — F331 Major depressive disorder, recurrent, moderate: Secondary | ICD-10-CM

## 2012-12-19 DIAGNOSIS — F4310 Post-traumatic stress disorder, unspecified: Secondary | ICD-10-CM

## 2012-12-19 DIAGNOSIS — F329 Major depressive disorder, single episode, unspecified: Secondary | ICD-10-CM | POA: Insufficient documentation

## 2012-12-19 NOTE — Progress Notes (Signed)
    Daily Group Progress Note  Program: IOP  Group Time: 9:00-10:30 am   Participation Level: None  Behavioral Response: none  Type of Therapy:  Process Group  Summary of Progress: Today was pts first day in the group and she was not present for this portion due to completing the initial assessment and orientation process.      Group Time: 10:30 am - 12:00 pm   Participation Level:  Active  Behavioral Response: Appropriate  Type of Therapy: Psycho-education Group  Summary of Progress: Pt participated in a group on grief and loss and identified current losses impacting overall wellness and effective grieving strategies.  Carman Ching, LCSW
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
## 2012-12-20 ENCOUNTER — Other Ambulatory Visit (HOSPITAL_COMMUNITY): Payer: BC Managed Care – PPO | Admitting: Psychiatry

## 2012-12-20 DIAGNOSIS — F332 Major depressive disorder, recurrent severe without psychotic features: Secondary | ICD-10-CM | POA: Diagnosis not present

## 2012-12-20 DIAGNOSIS — F329 Major depressive disorder, single episode, unspecified: Secondary | ICD-10-CM

## 2012-12-21 ENCOUNTER — Other Ambulatory Visit (HOSPITAL_COMMUNITY): Payer: BC Managed Care – PPO | Admitting: Psychiatry

## 2012-12-21 DIAGNOSIS — F332 Major depressive disorder, recurrent severe without psychotic features: Secondary | ICD-10-CM | POA: Diagnosis not present

## 2012-12-21 DIAGNOSIS — F329 Major depressive disorder, single episode, unspecified: Secondary | ICD-10-CM

## 2012-12-21 NOTE — Progress Notes (Signed)
    Daily Group Progress Note  Program: IOP  Group Time: 9:00-10:30 am   Participation Level: Active  Behavioral Response: Appropriate  Type of Therapy:  Process Group  Summary of Progress: Pt arrived forty-five minutes late and did not mention the reason for being tardy. Pt was talkative and required redirection to allow others time to share. pts main stressor is her mother and the conflicted relationship between them.      Group Time: 10:30 am - 12:00 pm   Participation Level:  Active  Behavioral Response: Appropriate  Type of Therapy: Psycho-education Group  Summary of Progress: Pt learned about how to use aromatherapy to manage depression, anxiety and improve sleep.  Carman Ching, LCSW

## 2012-12-22 ENCOUNTER — Other Ambulatory Visit (HOSPITAL_COMMUNITY): Payer: BC Managed Care – PPO | Admitting: Psychiatry

## 2012-12-22 DIAGNOSIS — F332 Major depressive disorder, recurrent severe without psychotic features: Secondary | ICD-10-CM | POA: Diagnosis not present

## 2012-12-22 DIAGNOSIS — F329 Major depressive disorder, single episode, unspecified: Secondary | ICD-10-CM

## 2012-12-22 NOTE — Progress Notes (Signed)
    Daily Group Progress Note  Program: IOP  Group Time: 9:00-10:30 am   Participation Level: Active  Behavioral Response: Appropriate  Type of Therapy:  Process Group  Summary of Progress: Pt described the symptoms of depression being experienced and discussed triggers that increase the symptoms.       Group Time: 10:30 am - 12:00 pm   Participation Level:  Active  Behavioral Response: Appropriate  Type of Therapy: Psycho-education Group  Summary of Progress: Pt learned about depression as a clinical medical condition and watched a video about celebrities talking openly about their depression that started a dialogue about stigma associated with depression.  Grahm Etsitty E, LCSW 

## 2012-12-23 ENCOUNTER — Other Ambulatory Visit (HOSPITAL_COMMUNITY): Payer: BC Managed Care – PPO | Admitting: Psychiatry

## 2012-12-23 DIAGNOSIS — F332 Major depressive disorder, recurrent severe without psychotic features: Secondary | ICD-10-CM | POA: Diagnosis not present

## 2012-12-23 DIAGNOSIS — F329 Major depressive disorder, single episode, unspecified: Secondary | ICD-10-CM

## 2012-12-23 NOTE — Progress Notes (Signed)
    Daily Group Progress Note  Program: IOP  Group Time: 9:00 am - 12:00 pm   Participation Level: Active  Behavioral Response: Appropriate  Type of Therapy:  Process Group  Summary of Progress: Pt participated in a discussion with a member from [facility] on the importance of support groups to manage mental health symptoms and learned about the different supportive services available to access during and after their group participation.        Kahlel Peake E, LCSW 

## 2012-12-23 NOTE — Progress Notes (Signed)
    Daily Group Progress Note  Program: IOP  Group Time: 9:00-10:30 am   Participation Level: Active  Behavioral Response: Appropriate  Type of Therapy:  Process Group  Summary of Progress: pt was less talkative today and allowed others more time to share. Pt is processessing her strained relationship with her mother and feeling judged and controlled by her and also shared about her difficult time raising her daughter and how she has behavioral outbursts.      Group Time: 10:30 am - 12:00 pm   Participation Level:  Active  Behavioral Response: Appropriate  Type of Therapy: Psycho-education Group  Summary of Progress: Pt said goodbye to two members ending group today and practiced the skill of healthy closure.   Carman Ching, LCSW

## 2012-12-23 NOTE — Progress Notes (Signed)
Patient ID: Kimberly Wells, female   DOB: 1975/07/15, 37 y.o.   MRN: [id number] Patient seen today, states that she feels very sleepy and has been experiencing dizziness on awakening. Patient is also experiencing a caffeine withdrawal as she has discontinued all caffeine. Discussed discontinuing the Ambien at this time and taking the Lexapro and the evening and also discussed increasing fluid intake and just to rise slowly to help with the postural hypotension patient stated understanding. Denies suicidal or homicidal ideation and has no hallucinations or delusions.

## 2012-12-26 ENCOUNTER — Telehealth (HOSPITAL_COMMUNITY): Payer: Self-pay | Admitting: Psychiatry

## 2012-12-26 ENCOUNTER — Other Ambulatory Visit (HOSPITAL_COMMUNITY): Payer: BC Managed Care – PPO

## 2012-12-27 ENCOUNTER — Other Ambulatory Visit (HOSPITAL_COMMUNITY): Payer: BC Managed Care – PPO | Admitting: Psychiatry

## 2012-12-27 DIAGNOSIS — F332 Major depressive disorder, recurrent severe without psychotic features: Secondary | ICD-10-CM | POA: Diagnosis not present

## 2012-12-27 DIAGNOSIS — F329 Major depressive disorder, single episode, unspecified: Secondary | ICD-10-CM

## 2012-12-28 ENCOUNTER — Other Ambulatory Visit (HOSPITAL_COMMUNITY): Payer: BC Managed Care – PPO

## 2012-12-29 ENCOUNTER — Other Ambulatory Visit (HOSPITAL_COMMUNITY): Payer: BC Managed Care – PPO | Admitting: Psychiatry

## 2012-12-29 DIAGNOSIS — F332 Major depressive disorder, recurrent severe without psychotic features: Secondary | ICD-10-CM | POA: Diagnosis not present

## 2012-12-29 DIAGNOSIS — F329 Major depressive disorder, single episode, unspecified: Secondary | ICD-10-CM

## 2012-12-29 NOTE — Progress Notes (Signed)
    Daily Group Progress Note  Program: IOP  Group Time: 9:00-10:30 am   Participation Level: None  Behavioral Response: none  Type of Therapy:  Process Group  Summary of Progress: Pt did not attend this group. She arrived for the second group saying she was "at an interview".      Group Time: 10:30 am - 12:00 pm   Participation Level:  Active  Behavioral Response: Appropriate  Type of Therapy: Psycho-education Group  Summary of Progress: Pt learned how to reduce anxiety and depression through the use of the heartmath technique and learned how to use it at home for ongoing wellness.   Carman Ching, LCSW

## 2012-12-30 ENCOUNTER — Other Ambulatory Visit (HOSPITAL_COMMUNITY): Payer: BC Managed Care – PPO | Admitting: Psychiatry

## 2012-12-30 DIAGNOSIS — F329 Major depressive disorder, single episode, unspecified: Secondary | ICD-10-CM

## 2012-12-30 DIAGNOSIS — F332 Major depressive disorder, recurrent severe without psychotic features: Secondary | ICD-10-CM | POA: Diagnosis not present

## 2012-12-31 ENCOUNTER — Encounter (HOSPITAL_COMMUNITY): Payer: Self-pay | Admitting: Psychiatry
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
## 2012-12-31 NOTE — Progress Notes (Signed)
    Daily Group Progress Note  Program: IOP  Group Time: 9-10:30 am  Participation Level: Active  Behavioral Response: Appropriate and Sharing  Type of Therapy:  Process Group  Summary of Progress: The patient arrived a little late, but had phoned ahead and explained her delay. She arrived and promptly engaged in the group discussion. She provided excellent feedback to her fellow group members. The patient seemed to understand the new group member's struggle with a husband who is a Optician, dispensing. The patient shared about her own pain in growing up in 'the church' with expectations and judgement rampant. The patient used the session to vent and 'dump' her own pain. She displayed a growing understanding of how her upbringing has contributed to her struggles and the ways in which she can address those as an adult.   Group Time: 10:45-12 pm  Participation Level:  Active  Behavioral Response: Sharing  Type of Therapy: Psycho-education Group  Summary of Progress: The patient drew her wheel and made excellent observations about what she needs to do to address the areas of her life in which she is struggling. She responded well to this intervention by recognizing that all of these issues, "can be changed by me".   Carman Ching, LCSW

## 2013-01-02 ENCOUNTER — Other Ambulatory Visit (HOSPITAL_COMMUNITY): Payer: BC Managed Care – PPO

## 2013-01-03 ENCOUNTER — Other Ambulatory Visit (HOSPITAL_COMMUNITY): Payer: BC Managed Care – PPO | Admitting: Psychiatry

## 2013-01-03 DIAGNOSIS — F329 Major depressive disorder, single episode, unspecified: Secondary | ICD-10-CM

## 2013-01-03 DIAGNOSIS — F332 Major depressive disorder, recurrent severe without psychotic features: Secondary | ICD-10-CM | POA: Diagnosis not present

## 2013-01-04 ENCOUNTER — Other Ambulatory Visit (HOSPITAL_COMMUNITY): Payer: BC Managed Care – PPO | Admitting: Psychiatry

## 2013-01-04 DIAGNOSIS — F332 Major depressive disorder, recurrent severe without psychotic features: Secondary | ICD-10-CM | POA: Diagnosis not present

## 2013-01-04 NOTE — Progress Notes (Signed)
    Daily Group Progress Note  Program: IOP  Group Time: 9:00-10:30 am   Participation Level: Active  Behavioral Response: Appropriate  Type of Therapy:  Process Group  Summary of Progress: Pt presents with energetic and chaotic behaviors and accelerated speech. Pt is attentive and remembers things group members said from days ago and applies it to her life. Pt is excited about a job opportunity in [place] and said she plans to leave her daughter with her parents while she gets settled down there if she receives the position. Pt reports elevated mood.      Group Time: 10:30 am - 12:00 pm   Participation Level:  Active  Behavioral Response: Appropriate  Type of Therapy: Psycho-education Group  Summary of Progress: Pt learned the skill of Mindfulness and how to use it to be in the moment and reduce feelings of stress and depression.   Carman Ching, LCSW

## 2013-01-04 NOTE — Progress Notes (Signed)
    Daily Group Progress Note  Program: IOP  Group Time: 9:00-10:30 am   Participation Level: Minimal  Behavioral Response: Appropriate  Type of Therapy:  Process Group  Summary of Progress: Pt stayed for the first thirty minutes of group and then left due to having a job interview she was preparing for.      Group Time: 10:30 am - 12:00 pm   Participation Level:  None  Behavioral Response: none  Type of Therapy: none  Summary of Progress: Pt did not attend   Atleigh Gruen E, LCSW

## 2013-01-05 ENCOUNTER — Other Ambulatory Visit (HOSPITAL_COMMUNITY): Payer: BC Managed Care – PPO | Admitting: Psychiatry

## 2013-01-05 DIAGNOSIS — F329 Major depressive disorder, single episode, unspecified: Secondary | ICD-10-CM

## 2013-01-05 DIAGNOSIS — F332 Major depressive disorder, recurrent severe without psychotic features: Secondary | ICD-10-CM | POA: Diagnosis not present
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
## 2013-01-05 NOTE — Progress Notes (Signed)
    Daily Group Progress Note  Program: IOP  Group Time: 9:00-10:30 am   Participation Level: Active  Behavioral Response: Appropriate  Type of Therapy:  Process Group  Summary of Progress: Today was Pts final day in the group. Pt said she no longer feels depressed and has had a significant improvement in her mood since starting in the group. Pt described using aromatherapy, relaxation, reframing and several other skills that are helping her cope more effectively with life situations. Pt is awaiting to hear if she received a job she applied for in [place] and is excited about this possibility.      Group Time: 10:30 am - 12:00 pm   Participation Level:  Active  Behavioral Response: Appropriate  Type of Therapy: Psycho-education Group  Summary of Progress: Pt participated in her goodbye ceremony and had closure with the other group members and practiced having a healthy goodbye.   Carman Ching, LCSW

## 2013-01-05 NOTE — Patient Instructions (Addendum)
Patient completed MH-IOP today.  Will follow up with Dr. Daleen Bo on 01-09-13 @ 2pm.  Encouraged support groups.

## 2013-01-06 ENCOUNTER — Other Ambulatory Visit (HOSPITAL_COMMUNITY): Payer: BC Managed Care – PPO

## 2013-01-09 ENCOUNTER — Ambulatory Visit (INDEPENDENT_AMBULATORY_CARE_PROVIDER_SITE_OTHER): Payer: BC Managed Care – PPO | Admitting: Psychiatry

## 2013-01-09 ENCOUNTER — Other Ambulatory Visit (HOSPITAL_COMMUNITY): Payer: BC Managed Care – PPO

## 2013-01-09 ENCOUNTER — Encounter (HOSPITAL_COMMUNITY): Payer: Self-pay | Admitting: Psychiatry

## 2013-01-09 VITALS — BP 114/70 | HR 70 | Ht 59.0 in | Wt 218.8 lb

## 2013-01-09 DIAGNOSIS — F332 Major depressive disorder, recurrent severe without psychotic features: Secondary | ICD-10-CM

## 2013-01-09 MED ORDER — ESCITALOPRAM OXALATE 20 MG PO TABS: 10.0000 mg | ORAL_TABLET | ORAL | Status: DC

## 2013-01-09 MED ORDER — QUETIAPINE FUMARATE 25 MG PO TABS: 25.0000 mg | ORAL_TABLET | Freq: Two times a day (BID) | ORAL | Status: DC

## 2013-01-09 NOTE — Addendum Note (Signed)
Addended by: Margit Banda D on: 01/09/2013 12:36 PM   Modules accepted: Orders
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
## 2013-01-09 NOTE — Progress Notes (Signed)
Discharge Note  Patient:  Kimberly Wells is an 37 y.o., female DOB:  Jul 01, 1975  Date of Admission:  12-19-12  Date of Discharge:  01-04-13  Reason for Admission: Depression and anxiety.  Hospital Course: Patient started IOP and was started on Lexapro 20 mg every day for her depression. Because of severe insomnia she was started on Ambien 12.5 mg at bedtime and sleep hygiene was discussed with her. Patient also started groups and was able to be an active participant fair giving and receiving feedback. Patient also did grief therapy in her groups, she gradually stabilized and her mood improved along with her sleep and appetite. She was coping very well. And tolerating her medications well.  Mental Status at Discharge: Alert, oriented x3, affect was full mood was euthymic speech was normal. No suicidal or homicidal ideation no hallucinations or delusions. Recent and remote memory is good, judgment and insight is good, concentration and recall are good.  Lab Results: No results found for this or any previous visit (from the past 48 hour(s)).  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 Kidney infection   . Anxiety   . Depression    Axis IV: economic problems, occupational problems, other psychosocial or environmental problems, problems related to social environment and problems with primary support group Axis V: 61-70 mild symptoms   Level of Care:  OP  Discharge destination:  Home  Is patient on multiple antipsychotic therapies at discharge:  No    Has Patient had  three or more failed trials of antipsychotic monotherapy by history:  No  Patient phone:  [phone] (home)  Patient address:   [address] [place] [address],   Follow-up recommendations:  Activity:  As tolerated Diet:  Regular Other:  Followup for medications with Dr. Daleen Bo, .  Comments:  NA  The patient received suicide prevention pamphlet:  Yes   Margit Banda 01/09/2013, 12:31 PM

## 2013-01-17 ENCOUNTER — Ambulatory Visit (INDEPENDENT_AMBULATORY_CARE_PROVIDER_SITE_OTHER): Payer: BC Managed Care – PPO | Admitting: Psychiatry

## 2013-01-17 ENCOUNTER — Ambulatory Visit (HOSPITAL_COMMUNITY): Payer: Self-pay | Admitting: Psychiatry

## 2013-01-17 DIAGNOSIS — F331 Major depressive disorder, recurrent, moderate: Secondary | ICD-10-CM

## 2013-01-17 DIAGNOSIS — F329 Major depressive disorder, single episode, unspecified: Secondary | ICD-10-CM

## 2013-01-17 MED ORDER — ESCITALOPRAM OXALATE 20 MG PO TABS: 10.0000 mg | ORAL_TABLET | ORAL | Status: DC

## 2013-01-17 MED ORDER — QUETIAPINE FUMARATE 25 MG PO TABS: 25.0000 mg | ORAL_TABLET | Freq: Two times a day (BID) | ORAL | Status: DC

## 2013-01-17 NOTE — Progress Notes (Signed)
  [facility] [address] Progress Note  Kimberly Wells [id number] 37 y.o.  01/17/2013 12:22 PM  Chief Complaint: ' depression, agitated"  History of Present Illness:KimberlyWells was seen for a follow up today of her depression. Reports tolerating the seroquel well, feeling less agitated. The decrease in Lexapro has improved her mood and reduced her tiredness. States the Pristiq had been more effective. She reports eating well, feeling more relaxed at night. States her daughter is keeping her up. She was able to take some time off, states it was helpful. She was able to clear her head and was able to make some decisions.    Suicidal Ideation: No Plan Formed: No Patient has means to carry out plan: No  Homicidal Ideation: No Plan Formed: No Patient has means to carry out plan: No  Review of Systems: Psychiatric: Agitation: No Hallucination: No Depressed Mood: No Insomnia: No Hypersomnia: No Altered Concentration: No Feels Worthless: No Grandiose Ideas: No Belief In Special Powers: No New/Increased Substance Abuse: No Compulsions: No  Neurologic: Headache: No Seizure: No Paresthesias: No  Past Medical Family, Social History: Living with her mother currently until she is stabilized financially.  Outpatient Encounter Prescriptions as of 01/17/2013  Medication Sig  . escitalopram (LEXAPRO) 20 MG tablet Take 0.5 tablets (10 mg total) by mouth every morning.  Marland Kitchen ibuprofen (ADVIL,MOTRIN) 600 MG tablet Take 1 tablet (600 mg total) by mouth every 6 (six) hours as needed for pain.  Marland Kitchen QUEtiapine (SEROQUEL) 25 MG tablet Take 1 tablet (25 mg total) by mouth 2 (two) times daily.    Past Psychiatric History/Hospitalization(s): Anxiety: Yes Bipolar Disorder: No Depression: Yes, improving Mania: No Psychosis: No Schizophrenia: No Personality Disorder: No Hospitalization for psychiatric illness: Yes History of Electroconvulsive Shock Therapy: No Prior Suicide Attempts:  No  Physical Exam: Constitutional:  There were no vitals taken for this visit.  General Appearance: alert, oriented, no acute distress  Musculoskeletal: Strength & Muscle Tone: within normal limits Gait & Station: normal Patient leans: N/A  Psychiatric: Speech (describe rate, volume, coherence, spontaneity, and abnormalities if any): normal rate  Thought Process (describe rate, content, abstract reasoning, and computation): normal  Associations: Coherent  Thoughts: normal  Mental Status: Orientation: oriented to person, place, time/date and situation Mood & Affect: normal affect Attention Span & Concentration: normal  Medical Decision Making (Choose Three): Established Problem, Stable/Improving (1), Review of Psycho-Social Stressors (1) and Review of Medication Regimen & Side Effects (2)  Assessment: Axis I: MDD Axis II: deferred  Axis III: denies  Axis IV: financial, occupational stressors  Axis V: 70   Plan: Continue current medication regimen. RTC in 1 month.  Patrick North, MD 01/17/2013

## 2013-01-30 ENCOUNTER — Ambulatory Visit (HOSPITAL_COMMUNITY): Payer: Self-pay | Admitting: Psychiatry

## 2013-03-15 ENCOUNTER — Ambulatory Visit (HOSPITAL_COMMUNITY): Payer: Self-pay | Admitting: Psychiatry

## 2013-03-17 ENCOUNTER — Ambulatory Visit (HOSPITAL_COMMUNITY): Payer: Self-pay | Admitting: Psychiatry

## 2013-05-03 ENCOUNTER — Ambulatory Visit (INDEPENDENT_AMBULATORY_CARE_PROVIDER_SITE_OTHER): Payer: BC Managed Care – PPO | Admitting: Psychiatry

## 2013-05-03 DIAGNOSIS — F331 Major depressive disorder, recurrent, moderate: Secondary | ICD-10-CM
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
## 2013-05-12 ENCOUNTER — Ambulatory Visit (HOSPITAL_COMMUNITY): Payer: Self-pay | Admitting: Psychiatry

## 2013-05-24 ENCOUNTER — Ambulatory Visit (HOSPITAL_COMMUNITY): Payer: Self-pay | Admitting: Psychiatry

## 2013-06-02 ENCOUNTER — Ambulatory Visit (HOSPITAL_COMMUNITY): Payer: Self-pay | Admitting: Psychiatry

## 2013-06-28 ENCOUNTER — Ambulatory Visit (HOSPITAL_COMMUNITY): Payer: Self-pay | Admitting: Psychiatry

## 2013-07-20 ENCOUNTER — Ambulatory Visit (HOSPITAL_COMMUNITY): Payer: Self-pay | Admitting: Psychiatry

## 2013-08-03 ENCOUNTER — Ambulatory Visit (HOSPITAL_COMMUNITY): Payer: Self-pay | Admitting: Psychiatry

## 2013-08-08 ENCOUNTER — Emergency Department (HOSPITAL_BASED_OUTPATIENT_CLINIC_OR_DEPARTMENT_OTHER)
Admission: EM | Admit: 2013-08-08 | Discharge: 2013-08-09 | Disposition: A | Discharge status: Home / Self Care | Payer: Medicaid Other | Attending: Emergency Medicine | Admitting: Emergency Medicine

## 2013-08-08 ENCOUNTER — Encounter (HOSPITAL_BASED_OUTPATIENT_CLINIC_OR_DEPARTMENT_OTHER): Payer: Self-pay | Admitting: Emergency Medicine

## 2013-08-08 ENCOUNTER — Emergency Department (HOSPITAL_BASED_OUTPATIENT_CLINIC_OR_DEPARTMENT_OTHER): Payer: Medicaid Other

## 2013-08-08 DIAGNOSIS — R600 Localized edema: Secondary | ICD-10-CM

## 2013-08-08 DIAGNOSIS — R109 Unspecified abdominal pain: Secondary | ICD-10-CM | POA: Insufficient documentation

## 2013-08-08 DIAGNOSIS — R609 Edema, unspecified: Secondary | ICD-10-CM | POA: Insufficient documentation

## 2013-08-08 DIAGNOSIS — Z87448 Personal history of other diseases of urinary system: Secondary | ICD-10-CM | POA: Insufficient documentation

## 2013-08-08 DIAGNOSIS — F329 Major depressive disorder, single episode, unspecified: Secondary | ICD-10-CM | POA: Insufficient documentation

## 2013-08-08 DIAGNOSIS — Z79899 Other long term (current) drug therapy: Secondary | ICD-10-CM | POA: Insufficient documentation

## 2013-08-08 DIAGNOSIS — F3289 Other specified depressive episodes: Secondary | ICD-10-CM | POA: Insufficient documentation

## 2013-08-08 DIAGNOSIS — M25569 Pain in unspecified knee: Secondary | ICD-10-CM | POA: Insufficient documentation

## 2013-08-08 DIAGNOSIS — F411 Generalized anxiety disorder: Secondary | ICD-10-CM | POA: Insufficient documentation

## 2013-08-08 LAB — CBC WITH DIFFERENTIAL/PLATELET
BASOS ABS: 0 10*3/uL (ref 0.0–0.1)
Basophils Relative: 0 % (ref 0–1)
Eosinophils Absolute: 0.2 10*3/uL (ref 0.0–0.7)
Eosinophils Relative: 3 % (ref 0–5)
HCT: 37.8 % (ref 36.0–46.0)
Hemoglobin: 12.2 g/dL (ref 12.0–15.0)
LYMPHS PCT: 23 % (ref 12–46)
Lymphs Abs: 2.2 10*3/uL (ref 0.7–4.0)
MCH: 26.8 pg (ref 26.0–34.0)
MCHC: 32.3 g/dL (ref 30.0–36.0)
MCV: 82.9 fL (ref 78.0–100.0)
Monocytes Absolute: 0.9 10*3/uL (ref 0.1–1.0)
Monocytes Relative: 9 % (ref 3–12)
NEUTROS ABS: 6.1 10*3/uL (ref 1.7–7.7)
Neutrophils Relative %: 65 % (ref 43–77)
PLATELETS: 317 10*3/uL (ref 150–400)
RBC: 4.56 MIL/uL (ref 3.87–5.11)
RDW: 13.1 % (ref 11.5–15.5)
WBC: 9.3 10*3/uL (ref 4.0–10.5)

## 2013-08-08 LAB — URINALYSIS, ROUTINE W REFLEX MICROSCOPIC
Bilirubin Urine: NEGATIVE
Glucose, UA: NEGATIVE mg/dL
Hgb urine dipstick: NEGATIVE
Ketones, ur: NEGATIVE mg/dL
NITRITE: NEGATIVE
PH: 5.5 (ref 5.0–8.0)
Protein, ur: NEGATIVE mg/dL
SPECIFIC GRAVITY, URINE: 1.026 (ref 1.005–1.030)
Urobilinogen, UA: 0.2 mg/dL (ref 0.0–1.0)

## 2013-08-08 LAB — URINE MICROSCOPIC-ADD ON

## 2013-08-08 LAB — BASIC METABOLIC PANEL
BUN: 15 mg/dL (ref 6–23)
CALCIUM: 9.2 mg/dL (ref 8.4–10.5)
CO2: 25 meq/L (ref 19–32)
CREATININE: 0.8 mg/dL (ref 0.50–1.10)
Chloride: 104 mEq/L (ref 96–112)
GFR calc non Af Amer: >90 mL/min (ref >90)
GLUCOSE: 79 mg/dL (ref 70–99)
POTASSIUM: 4.1 meq/L (ref 3.7–5.3)
SODIUM: 141 meq/L (ref 137–147)

## 2013-08-08 MED ORDER — KETOROLAC TROMETHAMINE 30 MG/ML IJ SOLN
30.0000 mg | Freq: Once | INTRAMUSCULAR | Status: AC
  Administered 2013-08-08: 30 mg via INTRAVENOUS
  Filled 2013-08-08: qty 1

## 2013-08-08 NOTE — ED Provider Notes (Signed)
CSN: [id number]     Arrival date & time 08/08/13  2129 History  This chart was scribed for Gerhard Munchobert Lockwood, MD by Phillis HaggisGabriella Gaje, ED Scribe. This patient was seen in room MH02/MH02 and patient care was started at 10:22 PM.   Chief Complaint  Patient presents with  . Leg Swelling   The history is provided by the patient. No language interpreter was used.   HPI Comments: Abbie Sonsickie N Caporale is a 38 y.o. female with a history of asthma who presents to the Emergency Department complaining of constant, moderate, bilateral leg pain and leg swelling. Patient states that she has been experiencing recurrent leg swelling that worsened 2 days ago. She reports that the swelling is worse in her right leg. She has also been having increased pain. She reports that it has been hurting mostly in her groin, ankle and calf area. She states that she has been seeing her PCP for this problem and he prescribed HCTZ, with no relief. She reports associated knee pain and lower abdominal pain. She states that she is scheduled to see her PCP tomorrow for this problem. She denies chest pain, and SOB. Patient had an Mirena IUD implanted 5 years ago and thinks her symptoms may be related to this. She denies history of DM and HTN. Patient denies allergies to medications.  Past Medical History  Diagnosis Date  . Kidney infection   . Anxiety   . Depression    History reviewed. No pertinent past surgical history. Family History  Problem Relation Age of Onset  . Depression Mother   . Anxiety disorder Mother   . Depression Father    History  Substance Use Topics  . Smoking status: Never Smoker   . Smokeless tobacco: Not on file  . Alcohol Use: No   OB History   Grav Para Term Preterm Abortions TAB SAB Ect Mult Living                 Review of Systems  Constitutional:       Per HPI, otherwise negative  HENT:       Per HPI, otherwise negative  Respiratory:       Per HPI, otherwise negative  Cardiovascular:        Per HPI, otherwise negative  Gastrointestinal: Negative for vomiting.  Endocrine:       Negative aside from HPI  Genitourinary:       Neg aside from HPI   Musculoskeletal:       Per HPI, otherwise negative  Skin: Negative.   Neurological: Negative for syncope.    Allergies  Review of patient's allergies indicates no known allergies.  Home Medications   Prior to Admission medications   Medication Sig Start Date End Date Taking? Authorizing Provider  escitalopram (LEXAPRO) 20 MG tablet Take 0.5 tablets (10 mg total) by mouth every morning. 01/17/13   Himabindu Ravi, MD  ibuprofen (ADVIL,MOTRIN) 600 MG tablet Take 1 tablet (600 mg total) by mouth every 6 (six) hours as needed for pain. 07/30/12   Renne CriglerJoshua Geiple, PA-C  QUEtiapine (SEROQUEL) 25 MG tablet Take 1 tablet (25 mg total) by mouth 2 (two) times daily. 01/17/13 01/17/14  Himabindu Ravi, MD   BP 120/83  Pulse 88  Temp(Src) 97.9 F (36.6 C) (Oral)  Resp 20  Ht 4\' 11"  (1.499 m)  Wt 227 lb (102.967 kg)  BMI 45.82 kg/m2  SpO2 100% Physical Exam  Nursing note and vitals reviewed. Constitutional: She is oriented to person, place,  and time. She appears well-developed and well-nourished. No distress.  HENT:  Head: Normocephalic and atraumatic.  Eyes: Conjunctivae and EOM are normal.  Cardiovascular: Normal rate, regular rhythm and normal heart sounds.   Pulmonary/Chest: Effort normal and breath sounds normal. No stridor. No respiratory distress. She has no wheezes. She has no rales.  Abdominal: Soft. Bowel sounds are normal. She exhibits no distension and no mass. There is no tenderness. There is no rebound and no guarding.  Non peritoneal  Musculoskeletal: She exhibits edema.  Asymmetric, non pitting edema to bilateral legs, right greater than left. Legs warm to the touch.   Neurological: She is alert and oriented to person, place, and time. No cranial nerve deficit.  Skin: Skin is warm and dry.  Psychiatric: She has a normal  mood and affect.    ED Course  Procedures (including critical care time)  COORDINATION OF CARE: 10:27 PM-Discussed treatment plan which includes US, labs and non-narcotics with pt at bedside and pt agreed to plan.   Labs Review Labs Reviewed  URINALYSIS, ROUTINE W REFLEX MICROSCOPIC - Abnormal; Notable for the following:    Leukocytes, UA MODERATE (*)    All other components within normal limits  BASIC METABOLIC PANEL  CBC WITH DIFFERENTIAL  URINE MICROSCOPIC-ADD ON    Imaging Review I discussed ultrasound results with the ultrasonographer.   MDM    I personally performed the services described in this documentation, which was scribed in my presence. The recorded information has been reviewed and is accurate.   Patient presents with concern ongoing bilateral lower extremity edema. Examination awake alert, in no distress.  She is afebrile, with no evidence of respiratory compromise. Patient's evaluation here is largely reassuring, with negative ultrasound bilaterally, reassuring labs.  Patient has no urinary complaints, and her leukocytes in the urine will be followed up with a urine culture. Patient is a previously scheduled follow up with primary care tomorrow, will be discharged to complete that evaluation.      Gerhard Munchobert Lockwood, MD 08/08/13 2352

## 2013-08-08 NOTE — ED Notes (Signed)
Pt back from ultrasound.

## 2013-08-08 NOTE — Discharge Instructions (Signed)
As discussed on today's evaluation is largely reassuring.  There is some evidence of abnormal urinalysis, but a secondary test is being conducted to complete evaluation for possible infection.  It is very important to keep your previously scheduled appointment with your physician tomorrow.  Please return here for any concerning changes in her condition should occur prior to that appointment.   Edema Edema is an abnormal buildup of fluids in your bodytissues. Edema is somewhatdependent on gravity to pull the fluid to the lowest place in your body. That makes the condition more common in the legs and thighs (lower extremities). Painless swelling of the feet and ankles is common and becomes more likely as you get older. It is also common in looser tissues, like around your eyes.  When the affected area is squeezed, the fluid may move out of that spot and leave a dent for a few moments. This dent is called pitting.  CAUSES  There are many possible causes of edema. Eating too much salt and being on your feet or sitting for a long time can cause edema in your legs and ankles. Hot weather may make edema worse. Common medical causes of edema include:  Heart failure.  Liver disease.  Kidney disease.  Weak blood vessels in your legs.  Cancer.  An injury.  Pregnancy.  Some medications.  Obesity. SYMPTOMS  Edema is usually painless.Your skin may look swollen or shiny.  DIAGNOSIS  Your health care provider may be able to diagnose edema by asking about your medical history and doing a physical exam. You may need to have tests such as X-rays, an electrocardiogram, or blood tests to check for medical conditions that may cause edema.  TREATMENT  Edema treatment depends on the cause. If you have heart, liver, or kidney disease, you need the treatment appropriate for these conditions. General treatment may include:  Elevation of the affected body part above the level of your heart.  Compression  of the affected body part. Pressure from elastic bandages or support stockings squeezes the tissues and forces fluid back into the blood vessels. This keeps fluid from entering the tissues.  Restriction of fluid and salt intake.  Use of a water pill (diuretic). These medications are appropriate only for some types of edema. They pull fluid out of your body and make you urinate more often. This gets rid of fluid and reduces swelling, but diuretics can have side effects. Only use diuretics as directed by your health care provider. HOME CARE INSTRUCTIONS   Keep the affected body part above the level of your heart when you are lying down.   Do not sit still or stand for prolonged periods.   Do not put anything directly under your knees when lying down.  Do not wear constricting clothing or garters on your upper legs.   Exercise your legs to work the fluid back into your blood vessels. This may help the swelling go down.   Wear elastic bandages or support stockings to reduce ankle swelling as directed by your health care provider.   Eat a low-salt diet to reduce fluid if your health care provider recommends it.   Only take medicines as directed by your health care provider. SEEK MEDICAL CARE IF:   Your edema is not responding to treatment.  You have heart, liver, or kidney disease and notice symptoms of edema.  You have edema in your legs that does not improve after elevating them.   You have sudden and unexplained weight  gain. SEEK IMMEDIATE MEDICAL CARE IF:   You develop shortness of breath or chest pain.   You cannot breathe when you lie down.  You develop pain, redness, or warmth in the swollen areas.   You have heart, liver, or kidney disease and suddenly get edema.  You have a fever and your symptoms suddenly get worse. MAKE SURE YOU:   Understand these instructions.  Will watch your condition.  Will get help right away if you are not doing well or get  worse. Document Released: 02/02/2005 Document Revised: 02/07/2013 Document Reviewed: 11/25/2012 [facility] Patient Information 2015 [employer], [place]. This information is not intended to replace advice given to you by your health care provider. Make sure you discuss any questions you have with your health care provider.

## 2013-08-08 NOTE — ED Notes (Signed)
Patient transported to ultrasound via stretcher per tech. 

## 2013-08-08 NOTE — ED Notes (Signed)
Right leg is swollen from thigh down to her ankle. Pain is worse around her knees.

## 2013-08-09 ENCOUNTER — Other Ambulatory Visit: Payer: Self-pay | Admitting: Family Medicine

## 2013-08-09 ENCOUNTER — Other Ambulatory Visit (HOSPITAL_COMMUNITY)
Admission: RE | Admit: 2013-08-09 | Discharge: 2013-08-09 | Disposition: A | Discharge status: Home / Self Care | Payer: Medicaid Other | Source: Ambulatory Visit | Attending: Family Medicine | Admitting: Family Medicine

## 2013-08-09 DIAGNOSIS — Z113 Encounter for screening for infections with a predominantly sexual mode of transmission: Secondary | ICD-10-CM | POA: Insufficient documentation

## 2013-08-09 DIAGNOSIS — N760 Acute vaginitis: Secondary | ICD-10-CM | POA: Insufficient documentation

## 2013-08-09 NOTE — ED Notes (Signed)
MD at bedside. 

## 2013-08-10 LAB — URINE CULTURE
Colony Count: NO GROWTH
Culture: NO GROWTH

## 2015-04-16 ENCOUNTER — Emergency Department (HOSPITAL_COMMUNITY)
Admission: EM | Admit: 2015-04-16 | Discharge: 2015-04-16 | Discharge status: Against Medical Advice | Payer: No Typology Code available for payment source

## 2015-04-16 NOTE — ED Notes (Signed)
Pt called, no answer. 1st attempt. 

## 2015-04-16 NOTE — ED Notes (Signed)
Pt called, no answer. 2nd attempt.

## 2015-12-06 ENCOUNTER — Ambulatory Visit (INDEPENDENT_AMBULATORY_CARE_PROVIDER_SITE_OTHER): Payer: No Typology Code available for payment source | Admitting: Family

## 2015-12-06 DIAGNOSIS — M1712 Unilateral primary osteoarthritis, left knee: Secondary | ICD-10-CM

## 2015-12-06 DIAGNOSIS — M1711 Unilateral primary osteoarthritis, right knee: Secondary | ICD-10-CM | POA: Diagnosis not present

## 2016-03-04 ENCOUNTER — Emergency Department

## 2016-03-04 ENCOUNTER — Inpatient Hospital Stay
Admit: 2016-03-04 | Discharge: 2016-03-04 | Disposition: A | Discharge status: Home / Self Care | Payer: PRIVATE HEALTH INSURANCE | Attending: Emergency Medicine

## 2016-03-04 ENCOUNTER — Emergency Department: Admit: 2016-03-04 | Payer: PRIVATE HEALTH INSURANCE

## 2016-03-04 DIAGNOSIS — R05 Cough: Secondary | ICD-10-CM

## 2016-03-04 LAB — CBC WITH AUTOMATED DIFF
ABS. BASOPHILS: 0 10*3/uL
ABS. EOSINOPHILS: 0.3 10*3/uL
ABS. LYMPHOCYTES: 1.6 10*3/uL
ABS. MONOCYTES: 0.2 10*3/uL
ABS. NEUTROPHILS: 0.8 10*3/uL
BASOPHILS: 0 %
EOSINOPHILS: 10 %
HCT: 40.8 % (ref 35.0–47.0)
HGB: 13 g/dL (ref 11.5–16.0)
LYMPHOCYTES: 55 %
MCH: 25.9 PG — ABNORMAL LOW (ref 26.0–34.0)
MCHC: 31.9 g/dL (ref 30.0–36.5)
MCV: 81.3 FL (ref 80.0–99.0)
MONOCYTES: 7 %
NEUTROPHILS: 28 %
PLATELET: 289 10*3/uL (ref 150–400)
RBC: 5.02 M/uL (ref 3.80–5.20)
RDW: 13.2 % (ref 11.5–14.5)
WBC: 2.9 10*3/uL — ABNORMAL LOW (ref 3.6–11.0)

## 2016-03-04 LAB — URINALYSIS W/ REFLEX CULTURE
Bacteria: NEGATIVE /hpf
Bilirubin: NEGATIVE
Blood: NEGATIVE
Glucose: NEGATIVE mg/dL
Ketone: NEGATIVE mg/dL
Leukocyte Esterase: NEGATIVE
Nitrites: NEGATIVE
Protein: NEGATIVE mg/dL
Specific gravity: 1.011 (ref 1.003–1.030)
Urobilinogen: 0.2 EU/dL (ref 0.2–1.0)
pH (UA): 7 (ref 5.0–8.0)

## 2016-03-04 LAB — EKG, 12 LEAD, INITIAL
Atrial Rate: 77 {beats}/min
Calculated P Axis: 37 degrees
Calculated R Axis: 22 degrees
Calculated T Axis: 18 degrees
Diagnosis: NORMAL
P-R Interval: 182 ms
Q-T Interval: 386 ms
QRS Duration: 68 ms
QTC Calculation (Bezet): 436 ms
Ventricular Rate: 77 {beats}/min

## 2016-03-04 LAB — INFLUENZA A & B AG (RAPID TEST)
Influenza A Antigen: NEGATIVE
Influenza B Antigen: NEGATIVE

## 2016-03-04 LAB — METABOLIC PANEL, COMPREHENSIVE
A-G Ratio: 0.8 — ABNORMAL LOW (ref 1.1–2.2)
ALT (SGPT): 26 U/L (ref 12–78)
AST (SGOT): 19 U/L (ref 15–37)
Albumin: 3.5 g/dL (ref 3.5–5.0)
Alk. phosphatase: 87 U/L (ref 45–117)
Anion gap: 5 mmol/L (ref 5–15)
BUN/Creatinine ratio: 6 — ABNORMAL LOW (ref 12–20)
BUN: 5 MG/DL — ABNORMAL LOW (ref 6–20)
Bilirubin, total: 0.3 MG/DL (ref 0.2–1.0)
CO2: 24 mmol/L (ref 21–32)
Calcium: 8.5 MG/DL (ref 8.5–10.1)
Chloride: 110 mmol/L — ABNORMAL HIGH (ref 97–108)
Creatinine: 0.81 MG/DL (ref 0.55–1.02)
GFR est AA: >60 mL/min/{1.73_m2} (ref >60)
GFR est non-AA: >60 mL/min/{1.73_m2} (ref >60)
Globulin: 4.2 g/dL — ABNORMAL HIGH (ref 2.0–4.0)
Glucose: 83 mg/dL (ref 65–100)
Potassium: 4 mmol/L (ref 3.5–5.1)
Protein, total: 7.7 g/dL (ref 6.4–8.2)
Sodium: 139 mmol/L (ref 136–145)

## 2016-03-04 LAB — D DIMER: D-dimer: 0.45 mg/L FEU (ref 0.00–0.65)

## 2016-03-04 LAB — TROPONIN I: Troponin-I, Qt.: <0.04 ng/mL (ref <0.05)

## 2016-03-04 LAB — HCG URINE, QL. - POC: Pregnancy test,urine (POC): NEGATIVE

## 2016-03-04 LAB — CK W/ CKMB & INDEX
CK - MB: <1 NG/ML (ref <3.6)
CK: 75 U/L (ref 26–192)

## 2016-03-04 LAB — NT-PRO BNP: NT pro-BNP: 76 PG/ML (ref 0–125)

## 2016-03-04 LAB — D-DIMER, QUANTITATIVE: D-Dimer, Quant: 0.45 mg/L FEU (ref 0.00–0.65)

## 2016-03-04 LAB — EKG 12-LEAD
Atrial Rate: 77 {beats}/min
Diagnosis: NORMAL
P Axis: 37 degrees
P-R Interval: 182 ms
Q-T Interval: 386 ms
QRS Duration: 68 ms
QTc Calculation (Bazett): 436 ms
R Axis: 22 degrees
T Axis: 18 degrees
Ventricular Rate: 77 {beats}/min

## 2016-03-04 MED ORDER — BENZONATATE 100 MG CAP
100 mg | ORAL_CAPSULE | Freq: Three times a day (TID) | ORAL | 0 refills | Status: AC | PRN
Start: 2016-03-04 — End: 2016-03-11

## 2016-03-04 MED ORDER — IPRATROPIUM-ALBUTEROL 2.5 MG-0.5 MG/3 ML NEB SOLUTION
2.5 mg-0.5 mg/3 ml | RESPIRATORY_TRACT | Status: AC
Start: 2016-03-04 — End: 2016-03-04
  Administered 2016-03-04: 10:00:00 via RESPIRATORY_TRACT

## 2016-03-04 MED ORDER — ALBUTEROL SULFATE HFA 90 MCG/ACTUATION AEROSOL INHALER
90 mcg/actuation | RESPIRATORY_TRACT | 0 refills | Status: AC | PRN
Start: 2016-03-04 — End: ?

## 2016-03-04 MED ORDER — KETOROLAC TROMETHAMINE 30 MG/ML INJECTION
30 mg/mL (1 mL) | INTRAMUSCULAR | Status: AC
Start: 2016-03-04 — End: 2016-03-04
  Administered 2016-03-04: 10:00:00 via INTRAVENOUS

## 2016-03-04 MED ORDER — SODIUM CHLORIDE 0.9% BOLUS IV
0.9 % | INTRAVENOUS | Status: DC
Start: 2016-03-04 — End: 2016-03-04

## 2016-03-04 MED ORDER — IPRATROPIUM-ALBUTEROL 2.5 MG-0.5 MG/3 ML NEB SOLUTION
2.5 mg-0.5 mg/3 ml | RESPIRATORY_TRACT | Status: AC
Start: 2016-03-04 — End: 2016-03-04

## 2016-03-04 MED ORDER — METHYLPREDNISOLONE (PF) 125 MG/2 ML IJ SOLR
125 mg/2 mL | INTRAMUSCULAR | Status: AC
Start: 2016-03-04 — End: 2016-03-04
  Administered 2016-03-04: 10:00:00 via INTRAVENOUS

## 2016-03-04 MED ORDER — METHYLPREDNISOLONE 4 MG TABS IN A DOSE PACK
4 mg | ORAL | 0 refills | Status: AC
Start: 2016-03-04 — End: ?

## 2016-03-04 MED ORDER — METHYLPREDNISOLONE (PF) 125 MG/2 ML IJ SOLR
125 mg/2 mL | INTRAMUSCULAR | Status: AC
Start: 2016-03-04 — End: 2016-03-04

## 2016-03-04 MED ORDER — KETOROLAC TROMETHAMINE 30 MG/ML INJECTION
30 mg/mL (1 mL) | INTRAMUSCULAR | Status: AC
Start: 2016-03-04 — End: 2016-03-04

## 2016-03-04 MED FILL — KETOROLAC TROMETHAMINE 30 MG/ML INJECTION: 30 mg/mL (1 mL) | INTRAMUSCULAR | Qty: 1

## 2016-03-04 MED FILL — SOLU-MEDROL (PF) 125 MG/2 ML SOLUTION FOR INJECTION: 125 mg/2 mL | INTRAMUSCULAR | Qty: 2

## 2016-03-04 MED FILL — IPRATROPIUM-ALBUTEROL 2.5 MG-0.5 MG/3 ML NEB SOLUTION: 2.5 mg-0.5 mg/3 ml | RESPIRATORY_TRACT | Qty: 3

## 2016-03-04 NOTE — ED Notes (Signed)
Pt given discharge information. All questions and concerns answered. No additional needs at this time. Pt ambulatory out.

## 2016-03-04 NOTE — ED Notes (Signed)
Patient feeling better after IV fluids and medications.  Dr. Lourdes SledgeKnorr notified.

## 2016-03-04 NOTE — ED Provider Notes (Signed)
EMERGENCY DEPARTMENT HISTORY AND PHYSICAL EXAM      Date: 03/04/2016  Patient Name: Brenda Ellis    History of Presenting Illness     Chief Complaint   Patient presents with   ??? Cough   ??? Chest Pain       History Provided By: Patient    HPI: Brenda Ellis is a 41 y.o. female, pmhx asthma / bronchitis / GERD, who presents ambulatory to the ED c/o persistent and intermittently productive cough x 1 month. Pt reports additional wheezing and burning CP, secondary to her cough, that began 2 days ago prompting her visit tonight. She states she has previously followed up with her PCP regarding her current sxs, but notes she was told "it's most likely a virus and will need to work itself out". She notes taking Mucinex, Robitussin, and other OTC medications with no relief of sxs. Pt reports her PCP wrote her an rx for ABX, but notes she didn't fill the medication. She notes a hx of asthma, but denies having a nebulizer or inhaler at home currently. Pt specifically denies any recent fevers, chills, nausea, vomiting, diarrhea, abd pain, SOB, urinary sxs, changes in BM, or headache.     PCP: Marva Panda     Allergies: NKDA  Social Hx: -tobacco, +EtOH (occasionally), -Illicit Drugs    There are no other complaints, changes, or physical findings at this time.     Current Facility-Administered Medications   Medication Dose Route Frequency Provider Last Rate Last Dose   ??? sodium chloride 0.9 % bolus infusion 1,000 mL  1,000 mL IntraVENous NOW Glean Hess, MD         Current Outpatient Prescriptions   Medication Sig Dispense Refill   ??? methylPREDNISolone (MEDROL, PAK,) 4 mg tablet Take as directed on packaging 1 Dose Pack 0   ??? albuterol (PROVENTIL HFA, VENTOLIN HFA, PROAIR HFA) 90 mcg/actuation inhaler Take 2 Puffs by inhalation every four (4) hours as needed for Wheezing. 1 Inhaler 0   ??? benzonatate (TESSALON PERLES) 100 mg capsule Take 1 Cap by mouth three  (3) times daily as needed for Cough for up to 7 days. 30 Cap 0       Past History     Past Medical History:  No past medical history on file.    Past Surgical History:  No past surgical history on file.    Family History:  No family history on file.    Social History:  Social History   Substance Use Topics   ??? Smoking status: Not on file   ??? Smokeless tobacco: Not on file   ??? Alcohol use Not on file       Allergies:  No Known Allergies      Review of Systems   Review of Systems   Constitutional: Negative.  Negative for chills and fever.   Eyes: Negative.    Respiratory: Positive for cough and wheezing. Negative for shortness of breath.    Cardiovascular: Positive for chest pain (burning).   Gastrointestinal: Negative for abdominal pain, nausea and vomiting.   Endocrine: Negative.    Genitourinary: Negative.  Negative for difficulty urinating, dysuria and hematuria.   Musculoskeletal: Negative.    Skin: Negative.    Allergic/Immunologic: Negative.    Neurological: Negative.    Psychiatric/Behavioral: Negative for suicidal ideas.   All other systems reviewed and are negative.      Physical Exam   Physical Exam   Constitutional: She is oriented  to person, place, and time. She appears well-developed and well-nourished. No distress.   HENT:   Head: Normocephalic and atraumatic.   Nose: Nose normal.   Eyes: Conjunctivae and EOM are normal. No scleral icterus.   Neck: Normal range of motion. No tracheal deviation present.   Cardiovascular: Normal rate, regular rhythm, normal heart sounds and intact distal pulses.  Exam reveals no friction rub.    No murmur heard.  Pulmonary/Chest: Effort normal and breath sounds normal. No stridor. No respiratory distress. She has no wheezes. She has no rales.   Pt able to speak in full, rapid, unlabored sentences.     Abdominal: Soft. Bowel sounds are normal. She exhibits no distension. There is no tenderness. There is no rebound.    Musculoskeletal: Normal range of motion. She exhibits no tenderness.   Neurological: She is alert and oriented to person, place, and time. No cranial nerve deficit.   Skin: Skin is warm and dry. No rash noted. She is not diaphoretic.   Psychiatric: She has a normal mood and affect. Her speech is normal and behavior is normal. Judgment and thought content normal. Cognition and memory are normal.   Nursing note and vitals reviewed.        Diagnostic Study Results     Labs -     Recent Results (from the past 12 hour(s))   CBC WITH AUTOMATED DIFF    Collection Time: 03/04/16  5:44 AM   Result Value Ref Range    WBC 2.9 (L) 3.6 - 11.0 K/uL    RBC 5.02 3.80 - 5.20 M/uL    HGB 13.0 11.5 - 16.0 g/dL    HCT 40.8 35.0 - 47.0 %    MCV 81.3 80.0 - 99.0 FL    MCH 25.9 (L) 26.0 - 34.0 PG    MCHC 31.9 30.0 - 36.5 g/dL    RDW 13.2 11.5 - 14.5 %    PLATELET 289 150 - 400 K/uL    NEUTROPHILS 28 %    LYMPHOCYTES 55 %    MONOCYTES 7 %    EOSINOPHILS 10 %    BASOPHILS 0 %    ABS. NEUTROPHILS 0.8 K/UL    ABS. LYMPHOCYTES 1.6 K/UL    ABS. MONOCYTES 0.2 K/UL    ABS. EOSINOPHILS 0.3 K/UL    ABS. BASOPHILS 0.0 K/UL    RBC COMMENTS NORMOCYTIC, NORMOCHROMIC      DF MANUAL     CK W/ CKMB & INDEX    Collection Time: 03/04/16  5:44 AM   Result Value Ref Range    CK 75 26 - 192 U/L    CK - MB <1.0 <3.6 NG/ML    CK-MB Index Cannot be calculated 0 - 2.5     METABOLIC PANEL, COMPREHENSIVE    Collection Time: 03/04/16  5:44 AM   Result Value Ref Range    Sodium 139 136 - 145 mmol/L    Potassium 4.0 3.5 - 5.1 mmol/L    Chloride 110 (H) 97 - 108 mmol/L    CO2 24 21 - 32 mmol/L    Anion gap 5 5 - 15 mmol/L    Glucose 83 65 - 100 mg/dL    BUN 5 (L) 6 - 20 MG/DL    Creatinine 0.81 0.55 - 1.02 MG/DL    BUN/Creatinine ratio 6 (L) 12 - 20      GFR est AA >60 >60 ml/min/1.22m    GFR est non-AA >60 >60 ml/min/1.763m  Calcium 8.5 8.5 - 10.1 MG/DL    Bilirubin, total 0.3 0.2 - 1.0 MG/DL    ALT (SGPT) 26 12 - 78 U/L    AST (SGOT) 19 15 - 37 U/L     Alk. phosphatase 87 45 - 117 U/L    Protein, total 7.7 6.4 - 8.2 g/dL    Albumin 3.5 3.5 - 5.0 g/dL    Globulin 4.2 (H) 2.0 - 4.0 g/dL    A-G Ratio 0.8 (L) 1.1 - 2.2     TROPONIN I    Collection Time: 03/04/16  5:44 AM   Result Value Ref Range    Troponin-I, Qt. <0.04 <0.05 ng/mL   NT-PRO BNP    Collection Time: 03/04/16  5:44 AM   Result Value Ref Range    NT pro-BNP 76 0 - 125 PG/ML   D DIMER    Collection Time: 03/04/16  5:44 AM   Result Value Ref Range    D-dimer 0.45 0.00 - 0.65 mg/L FEU   URINALYSIS W/ REFLEX CULTURE    Collection Time: 03/04/16  5:44 AM   Result Value Ref Range    Color YELLOW/STRAW      Appearance CLEAR CLEAR      Specific gravity 1.011 1.003 - 1.030      pH (UA) 7.0 5.0 - 8.0      Protein NEGATIVE  NEG mg/dL    Glucose NEGATIVE  NEG mg/dL    Ketone NEGATIVE  NEG mg/dL    Bilirubin NEGATIVE  NEG      Blood NEGATIVE  NEG      Urobilinogen 0.2 0.2 - 1.0 EU/dL    Nitrites NEGATIVE  NEG      Leukocyte Esterase NEGATIVE  NEG      WBC 0-4 0 - 4 /hpf    RBC 0-5 0 - 5 /hpf    Epithelial cells MODERATE (A) FEW /lpf    Bacteria NEGATIVE  NEG /hpf    UA:UC IF INDICATED CULTURE NOT INDICATED BY UA RESULT CNI      Hyaline cast 0-2 0 - 5 /lpf   INFLUENZA A & B AG (RAPID TEST)    Collection Time: 03/04/16  5:44 AM   Result Value Ref Range    Influenza A Antigen NEGATIVE  NEG      Influenza B Antigen NEGATIVE  NEG     HCG URINE, QL. - POC    Collection Time: 03/04/16  5:53 AM   Result Value Ref Range    Pregnancy test,urine (POC) NEGATIVE  NEG         Radiologic Studies -     CXR Results  (Last 48 hours)               03/04/16 0610  XR CHEST PA LAT Final result    Impression:  IMPRESSION:   No acute process.            Narrative:  INDICATION:   persistent cough, cp       COMPARISON: None       FINDINGS:       Frontal and lateral views of the chest demonstrate a normal cardiomediastinal   silhouette. The lungs are adequately expanded.  There is no edema, effusion,    consolidation, or pneumothorax. The osseous structures are unremarkable.                   Medical Decision Making   I am the first provider for this patient.  I reviewed the vital signs, available nursing notes, past medical history, past surgical history, family history and social history.    Vital Signs-Reviewed the patient's vital signs.  No data found.      Records Reviewed: Nursing Notes and Old Medical Records    Provider Notes (Medical Decision Making):     DDX:  Uri, pna, flu, pe, acs, chf    Plan:  Labs, cxr, flu swab, ua, neb, steroids    Impression:  Persistent cough    ED Course:   Initial assessment performed. The patients presenting problems have been discussed, and they are in agreement with the care plan formulated and outlined with them.  I have encouraged them to ask questions as they arise throughout their visit.    I reviewed our electronic medical record system for any past medical records that were available that may contribute to the patients current condition, the nursing notes and and vital signs from today's visit    Nursing notes will be reviewed as they become available in realtime while the pt has been in the ED.  Glean Hess, MD      EKG interpretation [id number]: NSR, nl Axis, rate 77; PR 182, QRS 68, QTc 436; no acute ischemia; Glean Hess, MD    I personally reviewed pt's imaging.  Official read by radiology listed below.  Glean Hess, MD    PROGRESS NOTE:  6:51 AM  Pt reevaluated. Pt noted to have improvement in her cough and feels that she is able to get some rest.  Pt is ready for discharge. Discussed lab and imaging findings with pt, specifically noting negative cxr d dimer and flu swab. Pt will follow up as instructed. All questions have been answered, pt voiced understanding and agreement with plan.     If narcotics were prescribed, pt was advised not to drive or operate heavy machinery. If abx were prescribed, pt advised that diarrhea and rash  are possible side effects of the medications.     Specific return precautions provided in addition to instructions for pt to return to the ED immediately should sx worsen at any time.  Glean Hess, MD    Downtime notation:    Pt noted to receive care in the ED during monthly downtime. Imaging and lab work may not be readily available in EMR.  Hard copy print outs of labs have been reviewed, imaging results (via "sticky notes" from Radiologist) have also been reviewed.  Glean Hess, MD    PLAN:  1.   Current Discharge Medication List      START taking these medications    Details   methylPREDNISolone (MEDROL, PAK,) 4 mg tablet Take as directed on packaging  Qty: 1 Dose Pack, Refills: 0      albuterol (PROVENTIL HFA, VENTOLIN HFA, PROAIR HFA) 90 mcg/actuation inhaler Take 2 Puffs by inhalation every four (4) hours as needed for Wheezing.  Qty: 1 Inhaler, Refills: 0      benzonatate (TESSALON PERLES) 100 mg capsule Take 1 Cap by mouth three (3) times daily as needed for Cough for up to 7 days.  Qty: 30 Cap, Refills: 0           2.   Follow-up Information     Follow up With Details Comments Contact Info    [facility]  As needed [place]  [phone]    your PCP Schedule an appointment as soon as possible  for a visit in 2 days          Return to ED if worse     Disposition:    DISCHARGE NOTE:  6:57 AM  The patient's results have been reviewed with family and/or caregiver. They verbally convey their understanding and agreement of the patient's signs, symptoms, diagnosis, treatment, and prognosis. They additionally agree to follow up as recommended in the discharge instructions or to return to the Emergency Room should the patient's condition change prior to their follow-up appointment. The family and/or caregiver verbally agrees with the care-plan and all of their questions have been answered. The discharge instructions have also been provided to the them along with  educational information regarding the patient's diagnosis and a list of reasons why the patient would want to return to the ER prior to their follow-up appointment should their condition change.  Written by Donny Pique, ED Scribe, as dictated by Glean Hess, MD.       Diagnosis     Clinical Impression:   1. Persistent cough for 3 weeks or longer        Attestations:    This note is prepared by Donny Pique, acting as Scribe for Glean Hess, MD    Glean Hess, MD : The scribe's documentation has been prepared under my direction and personally reviewed by me in its entirety. I confirm that the note above accurately reflects all work, treatment, procedures, and medical decision making performed by me.             This note will not be viewable in [facility].

## 2016-03-04 NOTE — ED Notes (Signed)
The documentation for this period is being entered following the guidelines as defined in the [facility] downtime policy by Christine Rericha, RN.

## 2016-03-04 NOTE — ED Notes (Signed)
Bedside shift change report given to Nicole (oncoming nurse) by Christine (offgoing nurse). Report included the following information SBAR, Kardex, ED Summary and MAR.

## 2016-03-04 NOTE — ED Triage Notes (Signed)
Cough and chest congestion for 5 days.  Brought to room for EKG.

## 2016-03-04 NOTE — ED Notes (Signed)
Patient discharged by Dr. Lourdes SledgeKnorr

## 2016-11-05 ENCOUNTER — Ambulatory Visit (INDEPENDENT_AMBULATORY_CARE_PROVIDER_SITE_OTHER): Payer: No Typology Code available for payment source | Admitting: Family

## 2016-11-11 ENCOUNTER — Ambulatory Visit (INDEPENDENT_AMBULATORY_CARE_PROVIDER_SITE_OTHER): Payer: No Typology Code available for payment source | Admitting: Family

## 2016-11-13 ENCOUNTER — Ambulatory Visit (INDEPENDENT_AMBULATORY_CARE_PROVIDER_SITE_OTHER): Payer: PRIVATE HEALTH INSURANCE

## 2016-11-13 ENCOUNTER — Encounter (INDEPENDENT_AMBULATORY_CARE_PROVIDER_SITE_OTHER): Payer: Self-pay | Admitting: Family

## 2016-11-13 ENCOUNTER — Ambulatory Visit (INDEPENDENT_AMBULATORY_CARE_PROVIDER_SITE_OTHER): Payer: PRIVATE HEALTH INSURANCE | Admitting: Family

## 2016-11-13 VITALS — Ht 59.0 in | Wt 208.0 lb

## 2016-11-13 DIAGNOSIS — G8929 Other chronic pain: Secondary | ICD-10-CM

## 2016-11-13 DIAGNOSIS — M25871 Other specified joint disorders, right ankle and foot: Secondary | ICD-10-CM | POA: Diagnosis not present

## 2016-11-13 DIAGNOSIS — M25561 Pain in right knee: Secondary | ICD-10-CM

## 2016-11-13 DIAGNOSIS — M25571 Pain in right ankle and joints of right foot: Secondary | ICD-10-CM

## 2016-11-13 DIAGNOSIS — M25562 Pain in left knee: Secondary | ICD-10-CM | POA: Diagnosis not present

## 2016-11-13 MED ORDER — LIDOCAINE HCL 1 % IJ SOLN
2.0000 mL | INTRAMUSCULAR | Status: AC | PRN
  Administered 2016-11-13: 2 mL

## 2016-11-13 MED ORDER — LIDOCAINE HCL 1 % IJ SOLN
5.0000 mL | INTRAMUSCULAR | Status: AC | PRN
  Administered 2016-11-13: 5 mL

## 2016-11-13 MED ORDER — METHYLPREDNISOLONE ACETATE 40 MG/ML IJ SUSP
40.0000 mg | INTRAMUSCULAR | Status: AC | PRN
  Administered 2016-11-13: 40 mg via INTRA_ARTICULAR

## 2016-11-13 NOTE — Progress Notes (Signed)
Office Visit Note   Patient: Kimberly Wells           Date of Birth: 03-31-1975           MRN: [id number] Visit Date: 11/13/2016              Requested by: Mirna Mires, MD [address] [place], [place] [address] PCP: Mirna Mires, MD  Chief Complaint  Patient presents with  . Joint Pain    multiple joint pain      HPI: The patient is a 41 year old woman seen today for evaluation of bilateral knee as well as right ankle pain. This has been a chronic issue. Knees ongoing for years. Did have DepoMedrol injection last year which provided interval relief bilaterally. Has been trying be more active, attempting weight loss. Complains of worse pain with squatting, which is common in the tae kwon do she does with her daughter. Complains of stiffness, pain with flexion. No mechanical symptoms. Does complain of grinding.  Right ankle pain ongoing for months. Waxes and wanes. Worse with pronged ambulation. Has tried Ibu, Aleve, rest, ice, compression hose. States has [employer] walking shoes which do reduce her pain, but these are "not cute and I can't be wearing them every day." does wear wedges most days. Is in flip flops today. Wonders if this is related to her flat feet.   Assessment & Plan: Visit Diagnoses:  1. Pain in right ankle and joints of right foot   2. Impingement syndrome of right ankle   3. Chronic pain of both knees     Plan: DepoMedrol injection right ankle today, impression - impingement syndrome. Discussed the importance of supportive shoe wear. Encouraged orthotics. Compression stockings for pedal and LE edema. Encouraged weight loss as well for knee pain and overall health. Bilateral knees injected as well.   Follow-Up Instructions: Return if symptoms worsen or fail to improve.   Right Ankle Exam  Swelling: mild  Tenderness  Right ankle tenderness location: anterior joint line, sinus tarsi.    Range of Motion  The patient has normal right ankle  ROM.  Tests  Anterior drawer: negative Other  Erythema: absent    Right Knee Exam   Tenderness  The patient is experiencing tenderness in the medial joint line.  Range of Motion  The patient has normal right knee ROM.  Muscle Strength   The patient has normal right knee strength.  Tests  Varus: negative Valgus: negative  Other  Other tests: no effusion present   Left Knee Exam   Tenderness  The patient is experiencing tenderness in the medial joint line.  Range of Motion  The patient has normal left knee ROM.  Muscle Strength   The patient has normal left knee strength.  Tests  Varus: negative Valgus: negative  Other  Effusion: no effusion present      Patient is alert, oriented, no adenopathy, well-dressed, normal affect, normal respiratory effort. Bilateral feet pes planus  Imaging: No results found. No images are attached to the encounter.  Labs: Lab Results  Component Value Date   REPTSTATUS 08/10/2013 FINAL 08/08/2013   CULT NO GROWTH Performed at [facility] 08/08/2013    Orders:  Orders Placed This Encounter  Procedures  . XR Ankle Complete Right   No orders of the defined types were placed in this encounter.    Procedures: Large Joint Inj Date/Time: 11/13/2016 12:25 PM Performed by: Barnie Del R Authorized by:  Jaedah Lords R   Consent Given by:  Patient Site marked: the procedure site was marked   Timeout: prior to procedure the correct patient, procedure, and site was verified   Indications:  Pain and diagnostic evaluation Location:  Knee Site:  R knee Needle Size:  22 G Needle Length:  1.5 inches Ultrasound Guidance: No   Fluoroscopic Guidance: No   Arthrogram: No   Medications:  5 mL lidocaine 1 %; 40 mg methylPREDNISolone acetate 40 MG/ML Aspiration Attempted: No   Patient tolerance:  Patient tolerated the procedure well with no immediate complications Large Joint Inj Date/Time: 11/13/2016 12:25  PM Performed by: Barnie Del R Authorized by: Barnie Del R   Consent Given by:  Patient Site marked: the procedure site was marked   Timeout: prior to procedure the correct patient, procedure, and site was verified   Indications:  Pain and diagnostic evaluation Location:  Knee Site:  L knee Needle Size:  22 G Needle Length:  1.5 inches Ultrasound Guidance: No   Fluoroscopic Guidance: No   Arthrogram: No   Medications:  5 mL lidocaine 1 %; 40 mg methylPREDNISolone acetate 40 MG/ML Aspiration Attempted: No   Patient tolerance:  Patient tolerated the procedure well with no immediate complications Medium Joint Inj Date/Time: 11/13/2016 12:25 PM Performed by: Barnie Del R Authorized by: Barnie Del R   Consent Given by:  Patient Site marked: the procedure site was marked   Timeout: prior to procedure the correct patient, procedure, and site was verified   Indications:  Pain and diagnostic evaluation Location:  Ankle Site:  R ankle Prep: patient was prepped and draped in usual sterile fashion   Needle Size:  22 G Needle Length:  1.5 inches Ultrasound Guided: No   Fluoroscopic Guidance: No   Medications:  2 mL lidocaine 1 %; 40 mg methylPREDNISolone acetate 40 MG/ML Aspiration Attempted: No   Patient tolerance:  Patient tolerated the procedure well with no immediate complications    Clinical Data: No additional findings.  ROS:  All other systems negative, except as noted in the HPI. Review of Systems  Constitutional: Negative for chills and fever.  Musculoskeletal: Positive for arthralgias and joint swelling.  Skin: Negative for color change.  Neurological: Negative for weakness and numbness.    Objective: Vital Signs: Ht  (1.499 m)   Wt 208 lb (94.3 kg)   BMI 42.01 kg/m   Specialty Comments:  No specialty comments available.  PMFS History: Patient Active Problem List   Diagnosis Date Noted  . Depression 12/19/2012   Past Medical History:   Diagnosis Date  . Anxiety   . Depression   . Kidney infection     Family History  Problem Relation Age of Onset  . Depression Mother   . Anxiety disorder Mother   . Depression Father     No past surgical history on file. Social History   Occupational History  . Not on file.   Social History Main Topics  . Smoking status: Never Smoker  . Smokeless tobacco: Never Used  . Alcohol use No  . Drug use: No  . Sexual activity: No

## 2017-07-15 ENCOUNTER — Other Ambulatory Visit: Payer: Self-pay | Admitting: Obstetrics and Gynecology

## 2017-07-15 DIAGNOSIS — R928 Other abnormal and inconclusive findings on diagnostic imaging of breast: Secondary | ICD-10-CM

## 2017-07-20 ENCOUNTER — Other Ambulatory Visit: Payer: Self-pay

## 2017-07-23 ENCOUNTER — Ambulatory Visit
Admission: RE | Admit: 2017-07-23 | Discharge: 2017-07-23 | Disposition: A | Discharge status: Home / Self Care | Payer: Self-pay | Source: Ambulatory Visit | Attending: Obstetrics and Gynecology | Admitting: Obstetrics and Gynecology

## 2017-07-23 DIAGNOSIS — R928 Other abnormal and inconclusive findings on diagnostic imaging of breast: Secondary | ICD-10-CM

## 2017-07-30 ENCOUNTER — Other Ambulatory Visit: Payer: Self-pay

## 2018-07-13 ENCOUNTER — Other Ambulatory Visit: Payer: Self-pay

## 2018-07-13 ENCOUNTER — Ambulatory Visit: Payer: Self-pay | Admitting: Family

## 2018-07-13 ENCOUNTER — Ambulatory Visit (INDEPENDENT_AMBULATORY_CARE_PROVIDER_SITE_OTHER): Payer: BLUE CROSS/BLUE SHIELD | Admitting: Family

## 2018-07-13 ENCOUNTER — Encounter: Payer: Self-pay | Admitting: Family

## 2018-07-13 VITALS — Ht 59.0 in | Wt 208.0 lb

## 2018-07-13 DIAGNOSIS — M170 Bilateral primary osteoarthritis of knee: Secondary | ICD-10-CM

## 2018-07-13 MED ORDER — METHYLPREDNISOLONE ACETATE 40 MG/ML IJ SUSP
40.0000 mg | INTRAMUSCULAR | Status: AC | PRN
  Administered 2018-07-13: 40 mg via INTRA_ARTICULAR

## 2018-07-13 MED ORDER — LIDOCAINE HCL 1 % IJ SOLN
5.0000 mL | INTRAMUSCULAR | Status: AC | PRN
  Administered 2018-07-13: 5 mL

## 2018-07-13 NOTE — Progress Notes (Signed)
Office Visit Note   Patient: Kimberly Wells           Date of Birth: [birth year]           MRN: [id number] Visit Date: 07/13/2018              Requested by: Mirna Mires, MD [address] [place], [place] [address] PCP: Mirna Mires, MD  Chief Complaint  Patient presents with  . Right Knee - Pain  . Left Knee - Pain      HPI: The patient is a 43 year old woman seen today for follow-up for bilateral knee pain.  She has constant 3 out of 10 knee pain.  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  Aware of these diagnoses for about 2 months now.  She has been working on weight loss with minimal relief of her pain.  States she was undergoing some massage therapies for her lymphedema however all of her physical therapies have been stopped due to the coronavirus.    She is requesting repeat injection of her bilateral knees today last injection was nearly 2 years ago and worked well.  She would like to discuss different treatment options today including gel injections.  Would like to put off discussion of any surgical intervention for quite some time.    Has knee pain this is worse with weightbearing increased pain with squatting complains of start up stiffness pain with flexion no mechanical symptoms   Assessment & Plan: Visit Diagnoses:  No diagnosis found.  Plan: DepoMedrol injections for bilateral knees today.  We will continue with these at this time we will consider the gel injections in the future.  Patient in agreement with the plan.  She has been taking Tylenol without relief for pain we will call in Duexis.  Provided with a sample today.  Follow-up in the office as needed.  Follow-Up Instructions: No follow-ups on file.   Right Knee Exam   Muscle Strength  The patient has normal right knee strength.  Tenderness  The patient is experiencing tenderness in the medial joint line.  Range of  Motion  The patient has normal right knee ROM.  Tests  Varus: negative Valgus: negative  Other  Effusion: no effusion present   Left Knee Exam   Muscle Strength  The patient has normal left knee strength.  Tenderness  The patient is experiencing tenderness in the medial joint line.  Range of Motion  The patient has normal left knee ROM.  Tests  Varus: negative Valgus: negative  Other  Effusion: no effusion present      Patient is alert, oriented, no adenopathy, well-dressed, normal affect, normal respiratory effort. Bilateral feet pes planus  Imaging: No results found. No images are attached to the encounter.  Labs: Lab Results  Component Value Date   REPTSTATUS 08/10/2013 FINAL 08/08/2013   CULT NO GROWTH Performed at [facility] 08/08/2013    Orders:  No orders of the defined types were placed in this encounter.  No orders of the defined types were placed in this encounter.    Procedures: Large Joint Inj: bilateral knee on 07/13/2018 12:10 PM Indications: pain Details: 18 G 1.5 in needle, anteromedial approach Medications (Right): 5 mL lidocaine 1 %; 40 mg methylPREDNISolone acetate 40 MG/ML Medications (Left): 5 mL lidocaine 1 %; 40 mg methylPREDNISolone acetate 40 MG/ML Consent was given by the patient.  Clinical Data: No additional findings.  ROS:  All other systems negative, except as noted in the HPI. Review of Systems  Constitutional: Negative for chills and fever.  Musculoskeletal: Positive for arthralgias and joint swelling.  Skin: Negative for color change.  Neurological: Negative for weakness and numbness.    Objective: Vital Signs: Ht 4\' 11"  (1.499 m)   Wt 208 lb (94.3 kg)   BMI 42.01 kg/m   Specialty Comments:  No specialty comments available.  PMFS History: Patient Active Problem List   Diagnosis Date Noted  . Depression 12/19/2012   Past Medical History:  Diagnosis Date  . Anxiety   . Depression    . Kidney infection     Family History  Problem Relation Age of Onset  . Depression Mother   . Anxiety disorder Mother   . Depression Father     No past surgical history on file. Social History   Occupational History  . Not on file  Tobacco Use  . Smoking status: Never Smoker  . Smokeless tobacco: Never Used  Substance and Sexual Activity  . Alcohol use: No  . Drug use: No  . Sexual activity: Never    Birth control/protection: I.U.D.

## 2018-07-17 ENCOUNTER — Telehealth: Payer: Self-pay

## 2018-07-17 ENCOUNTER — Telehealth: Payer: Self-pay | Admitting: Hematology

## 2018-07-17 DIAGNOSIS — Z20822 Contact with and (suspected) exposure to covid-19: Secondary | ICD-10-CM

## 2018-07-17 NOTE — Telephone Encounter (Signed)
Unable to contact patient. VM left to call back re: additional labs following potential covid exposure at [facility] PEC number and hours of operation left. UTA for covid s/sx following potential exposure.

## 2018-07-17 NOTE — Telephone Encounter (Signed)
Patient called back in regards to potential exposure at appointment on 07/13/2018 / Patient would like to get testing.  She lives in [place] and would like testing on Monday at [facility]. / This should be account billing.

## 2018-07-18 ENCOUNTER — Other Ambulatory Visit: Payer: Self-pay | Admitting: Hematology

## 2018-07-18 ENCOUNTER — Other Ambulatory Visit: Payer: Self-pay

## 2018-07-18 DIAGNOSIS — Z20822 Contact with and (suspected) exposure to covid-19: Secondary | ICD-10-CM

## 2018-07-18 NOTE — Progress Notes (Signed)
Order released in error.  COVID screen has been re-ordered. /

## 2018-07-19 LAB — NOVEL CORONAVIRUS, NAA: SARS-CoV-2, NAA: NOT DETECTED

## 2019-08-09 ENCOUNTER — Ambulatory Visit: Payer: Self-pay | Admitting: Family

## 2019-08-15 ENCOUNTER — Other Ambulatory Visit: Payer: Self-pay

## 2019-08-15 ENCOUNTER — Ambulatory Visit (INDEPENDENT_AMBULATORY_CARE_PROVIDER_SITE_OTHER): Payer: BC Managed Care – PPO | Admitting: Family Medicine

## 2019-08-15 ENCOUNTER — Encounter (INDEPENDENT_AMBULATORY_CARE_PROVIDER_SITE_OTHER): Payer: Self-pay | Admitting: Family Medicine

## 2019-08-15 VITALS — BP 114/76 | HR 75 | Temp 98.7°F | Wt 232.0 lb

## 2019-08-15 DIAGNOSIS — E559 Vitamin D deficiency, unspecified: Secondary | ICD-10-CM

## 2019-08-15 DIAGNOSIS — R0602 Shortness of breath: Secondary | ICD-10-CM

## 2019-08-15 DIAGNOSIS — E66813 Obesity, class 3: Secondary | ICD-10-CM

## 2019-08-15 DIAGNOSIS — Z9189 Other specified personal risk factors, not elsewhere classified: Secondary | ICD-10-CM | POA: Diagnosis not present

## 2019-08-15 DIAGNOSIS — G4709 Other insomnia: Secondary | ICD-10-CM

## 2019-08-15 DIAGNOSIS — F3289 Other specified depressive episodes: Secondary | ICD-10-CM

## 2019-08-15 DIAGNOSIS — Z6841 Body Mass Index (BMI) 40.0 and over, adult: Secondary | ICD-10-CM

## 2019-08-15 DIAGNOSIS — R5383 Other fatigue: Secondary | ICD-10-CM | POA: Diagnosis not present

## 2019-08-15 DIAGNOSIS — D508 Other iron deficiency anemias: Secondary | ICD-10-CM | POA: Diagnosis not present

## 2019-08-15 DIAGNOSIS — G4719 Other hypersomnia: Secondary | ICD-10-CM

## 2019-08-15 DIAGNOSIS — Z91018 Allergy to other foods: Secondary | ICD-10-CM

## 2019-08-15 DIAGNOSIS — Z0289 Encounter for other administrative examinations: Secondary | ICD-10-CM

## 2019-08-15 DIAGNOSIS — I890 Lymphedema, not elsewhere classified: Secondary | ICD-10-CM

## 2019-08-15 DIAGNOSIS — K219 Gastro-esophageal reflux disease without esophagitis: Secondary | ICD-10-CM

## 2019-08-15 NOTE — Progress Notes (Signed)
Chief Complaint:   OBESITY Kimberly Wells (MR# [id number]) is a 44 y.o. female who presents for evaluation and treatment of obesity and related comorbidities. Current BMI is Body mass index is 46.86 kg/m. Kimberly Wells has been struggling with her weight for many years and has been unsuccessful in either losing weight, maintaining weight loss, or reaching her healthy weight goal.  Kimberly Wells is currently in the action stage of change and ready to dedicate time achieving and maintaining a healthier weight. Kimberly Wells is interested in becoming our patient and working on intensive lifestyle modifications including (but not limited to) diet and exercise for weight loss.  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  She is single and lives with her 36 year old daughter, [name].  She works 50-60 hours per week.  She is lactose intolerant.  [name] provided the following food recall today:  Wakes at 5 am:  90 minute lymphedema machine. 6 am:  Gets daughter up. 6:30 am:  Breakfast - Egg, smoked salmon, blueberries. 7:00 am:  Walks dog. 12-1:  Lunch - walks dog. 6 pm:  Dinner - Fish/chicken and vegetables. 7 pm:  Walk. 9:30 pm:  Bed. Lunch or snack:  Fruit mid morning or salad with almonds, fruit.  Kimberly Wells's habits were reviewed today and are as follows: Her family eats meals together, she thinks her family will eat healthier with her, her desired weight loss is 58 pounds, she started gaining weight after pregnancy, her heaviest weight ever was her current weight, she craves meat and cheese, she wakes up frequently in the middle of the night hungry, she skips lunch frequently, she is trying to follow a vegetarian diet and she struggles with emotional eating.  Depression Screen Kyleigh's Food and Mood (modified PHQ-9) score was 16.  Depression screen [facility] 2/9 08/15/2019  Decreased Interest 3  Down, Depressed, Hopeless 2  PHQ - 2 Score 5  Altered sleeping 2  Tired, decreased energy 3  Change in  appetite 3  Feeling bad or failure about yourself  0  Trouble concentrating 3  Moving slowly or fidgety/restless 0  Suicidal thoughts 0  PHQ-9 Score 16  Difficult doing work/chores Somewhat difficult   Subjective:   1. Other fatigue Laura-Lee admits to daytime somnolence and reports waking up still tired. Patent has a history of symptoms of daytime fatigue, morning fatigue and morning headache. Kimberly Wells generally gets 6 hours of sleep per night, and states that she has poor quality sleep. Snoring is not present. Apneic episodes are not present. Epworth Sleepiness Score is 21.  2. SOB (shortness of breath) on exertion Kimberly Wells notes increasing shortness of breath with exercising and seems to be worsening over time with weight gain. She notes getting out of breath sooner with activity than she used to. This has not gotten worse recently. Kimberly Wells denies shortness of breath at rest or orthopnea.  3. Other iron deficiency anemia Kimberly Wells is not a vegetarian.  She does not have a history of weight loss surgery.  She is taking an oral iron supplement.  CBC Latest Ref Rng & Units 08/08/2013 09/30/2011 09/28/2011  WBC 4.0 - 10.5 K/uL 9.3 5.9 -  Hemoglobin 12.0 - 15.0 g/dL 14.4 11.9(L) 13.6  Hematocrit 36 - 46 % 37.8 36.0 40.0  Platelets 150 - 400 K/uL 317 278 -   4. Vitamin D deficiency She is currently taking OTC vitamin D 1000 IU each day. She denies nausea, vomiting or muscle weakness.  5. 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  [facility] is [facility].  6. Excessive daytime sleepiness Epworth sleepiness score is 21.  Situation Chance of Dozing or Sleeping  Sitting and reading 3 = high chance of dozing or sleeping  Watching television 3 = high chance of dozing or sleeping  Sitting inactive in a public place (theater or meeting) 3 = high chance of dozing or sleeping  Sitting as a passenger in a car for an hour 3 = high chance of dozing or sleeping  Lying down in the afternoon  when circumstances permit 3 = high chance of dozing or sleeping  Sitting and talking to someone 1 = slight chance of dozing or sleeping  Sitting quietly after lunch without alcohol 3 = high chance of dozing or sleeping  In a car, while stopped for a few minutes in traffic 2 = moderate chance of dozing or sleeping  TOTAL 21   7. Gastroesophageal reflux disease, unspecified whether esophagitis present Kimberly Wells takes Pepcid [facility] for her symptoms of GERD.  8. Food allergy Kimberly Wells has several food allergies.  9. Other insomnia [name] says that wine helps her relax.  She previously used CBD.  10. Other depression, with emotional eating Kimberly Wells is struggling with emotional eating and using food for comfort to the extent that it is negatively impacting her health. She has been working on behavior modification techniques to help reduce her emotional eating and has been unsuccessful. She shows no sign of suicidal or homicidal ideations.  PHQ-9 is 16. Also with anxiety, ADHD, PTSD.  She is taking Focalin XR.  She eats when stressed, sad, angry.  Eats for comfort, reward, and to stay awake.  11. At risk for heart disease Kimberly Wells is at a higher than average risk for cardiovascular disease due to obesity.   Assessment/Plan:   1. Other fatigue Kimberly Wells does not feel that her weight is causing her energy to be lower than it should be. Fatigue may be related to obesity, depression or many other causes. Labs will be ordered, and in the meanwhile, Kimberly Wells will focus on self care including making healthy food choices, increasing physical activity and focusing on stress reduction.  Orders - EKG 12-Lead  2. SOB (shortness of breath) on exertion Kimberly Wells does not feel that she gets out of breath more easily that she used to when she exercises. Kimberly Wells's shortness of breath appears to be obesity related and exercise induced. She has agreed to work on weight loss and gradually increase exercise to treat her exercise induced  shortness of breath. Will continue to monitor closely.  3. Other iron deficiency anemia Orders and follow up as documented in patient record.  Counseling . Iron is essential for our bodies to make red blood cells.  Reasons that someone may be deficient include: an iron-deficient diet (more likely in those following vegan or vegetarian diets), women with heavy menses, patients with GI disorders or poor absorption, patients that have had bariatric surgery, frequent blood donors, patients with cancer, and patients with heart disease.   Gaspar Cola foods include dark leafy greens, red and white meats, eggs, seafood, and beans.   . Certain foods and drinks prevent your body from absorbing iron properly. Avoid eating these foods in the same meal as iron-rich foods or with iron supplements. These foods include: coffee, black tea, and red wine; milk, dairy products, and foods that are high in calcium; beans and soybeans; whole grains.  . Constipation can be a side effect of iron supplementation. Increased  water and fiber intake are helpful. Water goal: > 2 liters/day. Fiber goal: > 25 grams/day.  4. Vitamin D deficiency Low Vitamin D level contributes to fatigue and are associated with obesity, breast, and colon cancer. She agrees to continue to take OTC Vitamin D ,000 IU daily and will follow-up for routine testing of Vitamin D, at least 2-3 times per year to avoid over-replacement.  5. Lymphedema Followed by [facility] for this problem. Those encounter notes were reviewed.  6. Excessive daytime sleepiness Consider sleep evaluation.  7. Gastroesophageal reflux disease, unspecified whether esophagitis present Intensive lifestyle modifications are the first line treatment for this issue. We discussed several lifestyle modifications today and she will continue to work on diet, exercise and weight loss efforts. Orders and follow up as documented in patient record.   Counseling . If a person has  gastroesophageal reflux disease (GERD), food and stomach acid move back up into the esophagus and cause symptoms or problems such as damage to the esophagus. . Anti-reflux measures include: raising the head of the bed, avoiding tight clothing or belts, avoiding eating late at night, not lying down shortly after mealtime, and achieving weight loss. . Avoid ASA, NSAID's, caffeine, alcohol, and tobacco.  . OTC Pepcid and/or Tums are often very helpful for as needed use.  Marland Kitchen However, for persisting chronic or daily symptoms, stronger medications like Omeprazole may be needed. . You may need to avoid foods and drinks such as: ? Coffee and tea (with or without caffeine). ? Drinks that contain alcohol. ? Energy drinks and sports drinks. ? Bubbly (carbonated) drinks or sodas. ? Chocolate and cocoa. ? Peppermint and mint flavorings. ? Garlic and onions. ? Horseradish. ? Spicy and acidic foods. These include peppers, chili powder, curry powder, vinegar, hot sauces, and BBQ sauce. ? Citrus fruit juices and citrus fruits, such as oranges, lemons, and limes. ? Tomato-based foods. These include red sauce, chili, salsa, and pizza with red sauce. ? Fried and fatty foods. These include donuts, french fries, potato chips, and high-fat dressings. ? High-fat meats. These include hot dogs, rib eye steak, sausage, ham, and bacon.  Orders - H Pylori, IGM, IGG, IGA AB  8. Food allergy Will refer to Allergy today.  9. Other insomnia The problem of recurrent insomnia was discussed. Orders and follow up as documented in patient record. Counseling: Intensive lifestyle modifications are the first line treatment for this issue. We discussed several lifestyle modifications today and she will continue to work on diet, exercise and weight loss efforts.   Counseling  Limit or avoid alcohol, caffeinated beverages, and cigarettes, especially close to bedtime.   Do not eat a large meal or eat spicy foods right before  bedtime. This can lead to digestive discomfort that can make it hard for you to sleep.  Keep a sleep diary to help you and your health care provider figure out what could be causing your insomnia.  . Make your bedroom a dark, comfortable place where it is easy to fall asleep. ? Put up shades or blackout curtains to block light from outside. ? Use a white noise machine to block noise. ? Keep the temperature cool. . Limit screen use before bedtime. This includes: ? Watching TV. ? Using your smartphone, tablet, or computer. . Stick to a routine that includes going to bed and waking up at the same times every day and night. This can help you fall asleep faster. Consider making a quiet activity, such as reading, part  of your nighttime routine. . Try to avoid taking naps during the day so that you sleep better at night. . Get out of bed if you are still awake after 15 minutes of trying to sleep. Keep the lights down, but try reading or doing a quiet activity. When you feel sleepy, go back to bed.  10. Other depression, with emotional eating Behavior modification techniques were discussed today to help Emri deal with her emotional/non-hunger eating behaviors.  Orders and follow up as documented in patient record.   11. At risk for heart disease Kanika was given approximately 15 minutes of coronary artery disease prevention counseling today. She is 44 y.o. female and has risk factors for heart disease including obesity. We discussed intensive lifestyle modifications today with an emphasis on specific weight loss instructions and strategies.   Repetitive spaced learning was employed today to elicit superior memory formation and behavioral change.  12. Class 3 severe obesity with serious comorbidity and body mass index (BMI) of 40.0 to 44.9 in adult, unspecified obesity type (HCC) Jahmiya is currently in the action stage of change and her goal is to continue with weight loss efforts. I recommend Vallory  begin the structured treatment plan as follows:  She has agreed to the [employer].  Exercise goals: No exercise has been prescribed at this time.   Behavioral modification strategies: increasing lean protein intake.  She was informed of the importance of frequent follow-up visits to maximize her success with intensive lifestyle modifications for her multiple health conditions. She was informed we would discuss her lab results at her next visit unless there is a critical issue that needs to be addressed sooner. Iveliz agreed to keep her next visit at the agreed upon time to discuss these results.  Objective:   Blood pressure 114/76, pulse 75, temperature 98.7 F (37.1 C), temperature source Oral, weight 232 lb (105.2 kg), SpO2 100 %. Body mass index is 46.86 kg/m.  EKG: Normal sinus rhythm, rate 80 bpm.  Indirect Calorimeter completed today shows a VO2 of 242 and a REE of 1682.  Her calculated basal metabolic rate is 6378 thus her basal metabolic rate is better than expected.  General: Cooperative, alert, well developed, in no acute distress. HEENT: Conjunctivae and lids unremarkable. Cardiovascular: Regular rhythm.  Lungs: Normal work of breathing. Neurologic: No focal deficits.   Lab Results  Component Value Date   CREATININE 0.80 08/08/2013   BUN 15 08/08/2013   NA 141 08/08/2013   K 4.1 08/08/2013   CL 104 08/08/2013   CO2 25 08/08/2013   Lab Results  Component Value Date   ALT 17 09/30/2011   AST 16 09/30/2011   ALKPHOS 80 09/30/2011   BILITOT 0.2 (L) 09/30/2011   Lab Results  Component Value Date   WBC 9.3 08/08/2013   HGB 12.2 08/08/2013   HCT 37.8 08/08/2013   MCV 82.9 08/08/2013   PLT 317 08/08/2013   Attestation Statements:   This is the patient's first visit at [facility]. The patient's NEW PATIENT PACKET was reviewed at length. Included in the packet: current and past health history, medications, allergies, ROS, gynecologic history  (women only), surgical history, family history, social history, weight history, weight loss surgery history (for those that have had weight loss surgery), nutritional evaluation, mood and food questionnaire, PHQ9, Epworth questionnaire, sleep habits questionnaire, patient life and health improvement goals questionnaire. These will all be scanned into the patient's chart under media.   During the visit,  I independently reviewed the patient's EKG, bioimpedance scale results, and indirect calorimeter results. I used this information to tailor a meal plan for the patient that will help her to lose weight and will improve her obesity-related conditions going forward. I performed a medically necessary appropriate examination and/or evaluation. I discussed the assessment and treatment plan with the patient. The patient was provided an opportunity to ask questions and all were answered. The patient agreed with the plan and demonstrated an understanding of the instructions. Labs were ordered at this visit and will be reviewed at the next visit unless more critical results need to be addressed immediately. Clinical information was updated and documented in the EMR.   I, Insurance claims handlerAmber Agner, CMA, am acting as transcriptionist for Helane RimaErica Brayley Mackowiak, DO  I have reviewed the above documentation for accuracy and completeness, and I agree with the above. Helane Rima- Dillan Lunden, DO

## 2019-08-16 ENCOUNTER — Ambulatory Visit: Payer: Self-pay

## 2019-08-16 ENCOUNTER — Encounter (INDEPENDENT_AMBULATORY_CARE_PROVIDER_SITE_OTHER): Payer: Self-pay | Admitting: Family Medicine

## 2019-08-16 ENCOUNTER — Ambulatory Visit (INDEPENDENT_AMBULATORY_CARE_PROVIDER_SITE_OTHER): Payer: BC Managed Care – PPO | Admitting: Family

## 2019-08-16 DIAGNOSIS — G894 Chronic pain syndrome: Secondary | ICD-10-CM

## 2019-08-16 DIAGNOSIS — I890 Lymphedema, not elsewhere classified: Secondary | ICD-10-CM | POA: Diagnosis not present

## 2019-08-16 DIAGNOSIS — M170 Bilateral primary osteoarthritis of knee: Secondary | ICD-10-CM | POA: Diagnosis not present

## 2019-08-16 NOTE — Progress Notes (Signed)
Office Visit Note   Patient: Kimberly Wells           Date of Birth: [birth year]           MRN: [id number] Visit Date: 08/16/2019              Requested by: Mirna Mires, MD [address] [place],  [place] [address] PCP: Mirna Mires, MD  Chief Complaint  Patient presents with  . Right Knee - Pain  . Left Knee - Pain      HPI: The 44 year old woman who presents today complaining of bilateral knee pain.  This is been ongoing for several years.  She has known osteoarthritis of her knees complains of bone-on-bone rubbing bilateral knees.  The right is somewhat worse than the left.  She has pain with ambulation and exercise complains of start up stiffness.  She is currently in the most common healthy weight loss program.  She has been attempting weight loss over the last year.  Feels that her weight and knee pain do limit her ability for exercise.  Has had modest relief from depomedrol injections to bilateral knees in past. Is interested in discussing hyaluronic acid injections today.  She is also currently in physical therapy for her lymphedema has recently gotten set up with pumps.  She is following with podiatry for bilateral heel spurs she currently has a brace for her right foot, a night splint.  Is interested in a night splint for her left.  With all of her current health issues and multi joint involvement; she is quite concerned that she may have an autoimmune issue as well.  She is requesting referral to Dr. Corliss Skains. She has not yet had a rheum panel drawn.   Assessment & Plan: Visit Diagnoses:  1. Bilateral primary osteoarthritis of knee   2. Lymphedema of upper extremity, bilateral   3. Chronic pain syndrome     Plan: will set up prior authorization for bilateral knee hyaluronic acid injections. Is to have lab work drawn tomorrow at [facility]. Will add on rheum screen. Consider referral to rheum. Encouraged continued low impact physical activity.  Follow-Up Instructions:  Return for supplemental injections.   Right Knee Exam   Muscle Strength  The patient has normal right knee strength.  Tenderness  The patient is experiencing tenderness in the medial joint line.  Range of Motion  The patient has normal right knee ROM.  Tests  Varus: negative Valgus: negative  Other  Erythema: absent Swelling: none   Left Knee Exam   Muscle Strength  The patient has normal left knee strength.  Tenderness  The patient is experiencing tenderness in the medial joint line.  Range of Motion  The patient has normal left knee ROM.  Tests  Varus: negative Valgus: negative  Other  Erythema: absent Swelling: none      Patient is alert, oriented, no adenopathy, well-dressed, normal affect, normal respiratory effort.   Imaging: No results found. No images are attached to the encounter.  Labs: Lab Results  Component Value Date   REPTSTATUS 08/10/2013 FINAL 08/08/2013   CULT NO GROWTH Performed at [facility] 08/08/2013     Lab Results  Component Value Date   ALBUMIN 3.4 (L) 09/30/2011   ALBUMIN 3.8 01/16/2009    No results found for: MG No results found for: VD25OH  No results found for: PREALBUMIN CBC EXTENDED Latest Ref Rng & Units 08/08/2013 09/30/2011 09/28/2011  WBC 4.0 - 10.5 K/uL  9.3 5.9 -  RBC 3.87 - 5.11 MIL/uL 4.56 4.47 -  HGB 12.0 - 15.0 g/dL 83.1 11.9(L) 13.6  HCT 36 - 46 % 37.8 36.0 40.0  PLT 150 - 400 K/uL 317 278 -  NEUTROABS 1.7 - 7.7 K/uL 6.1 3.7 -  LYMPHSABS 0.7 - 4.0 K/uL 2.2 1.5 -     There is no height or weight on file to calculate BMI.  Orders:  Orders Placed This Encounter  Procedures  . XR KNEE 3 VIEW RIGHT  . XR KNEE 3 VIEW LEFT  . Rheumatoid Arthritis Diagnostic Panel   No orders of the defined types were placed in this encounter.    Procedures: No procedures performed  Clinical Data: No additional findings.  ROS:  All other systems negative, except as noted in the HPI. Review of  Systems  Constitutional: Negative for chills and fever.  Respiratory: Negative for shortness of breath.   Cardiovascular: Positive for leg swelling. Negative for chest pain.  Musculoskeletal: Positive for arthralgias, gait problem and myalgias. Negative for joint swelling.  Neurological: Negative for weakness and numbness.    Objective: Vital Signs: There were no vitals taken for this visit.  Specialty Comments:  No specialty comments available.  PMFS History: Patient Active Problem List   Diagnosis Date Noted  . Depression 12/19/2012   Past Medical History:  Diagnosis Date  . ADHD   . Anemia   . Anxiety   . Asthma   . B12 deficiency   . Depression   . Depression   . GERD (gastroesophageal reflux disease)   . Joint pain   . Kidney infection   . Lactose intolerance   . Lymphedema   . Multiple food allergies   . Sleep apnea   . Vitamin D deficiency     Family History  Problem Relation Age of Onset  . Depression Mother   . Anxiety disorder Mother   . Hypertension Mother   . Hyperlipidemia Mother   . Heart disease Mother   . Obesity Mother   . Depression Father     No past surgical history on file. Social History   Occupational History  . Occupation: Buyer, retail  Tobacco Use  . Smoking status: Never Smoker  . Smokeless tobacco: Never Used  Substance and Sexual Activity  . Alcohol use: No  . Drug use: No  . Sexual activity: Never    Birth control/protection: I.U.D.

## 2019-08-17 ENCOUNTER — Telehealth: Payer: Self-pay

## 2019-08-17 NOTE — Telephone Encounter (Signed)
This was in a staff message. I put it in a telephone call so it can stay in patient's chart instead. Thanks!   Adonis Huguenin, NP  [place], [place], [place] Would you mind order hyaluronic acid injections for b knees

## 2019-08-17 NOTE — Telephone Encounter (Signed)
Submitted for VOB for Synvisc one- bilateral knee

## 2019-08-18 ENCOUNTER — Telehealth: Payer: Self-pay

## 2019-08-18 NOTE — Telephone Encounter (Signed)
Changed to VOB for Gel One-Bilateral knee

## 2019-08-29 ENCOUNTER — Ambulatory Visit (INDEPENDENT_AMBULATORY_CARE_PROVIDER_SITE_OTHER): Payer: PRIVATE HEALTH INSURANCE | Admitting: Family Medicine

## 2019-08-29 ENCOUNTER — Encounter (INDEPENDENT_AMBULATORY_CARE_PROVIDER_SITE_OTHER): Payer: Self-pay

## 2019-08-30 ENCOUNTER — Ambulatory Visit (INDEPENDENT_AMBULATORY_CARE_PROVIDER_SITE_OTHER): Payer: BC Managed Care – PPO | Admitting: Family Medicine

## 2019-08-30 ENCOUNTER — Encounter (INDEPENDENT_AMBULATORY_CARE_PROVIDER_SITE_OTHER): Payer: Self-pay | Admitting: Family Medicine

## 2019-08-30 ENCOUNTER — Other Ambulatory Visit: Payer: Self-pay

## 2019-08-30 VITALS — BP 110/73 | HR 95 | Temp 98.0°F | Ht 61.0 in | Wt 231.0 lb

## 2019-08-30 DIAGNOSIS — E8881 Metabolic syndrome: Secondary | ICD-10-CM

## 2019-08-30 DIAGNOSIS — E88819 Insulin resistance, unspecified: Secondary | ICD-10-CM

## 2019-08-30 DIAGNOSIS — R1013 Epigastric pain: Secondary | ICD-10-CM

## 2019-08-30 DIAGNOSIS — Z6841 Body Mass Index (BMI) 40.0 and over, adult: Secondary | ICD-10-CM

## 2019-08-30 DIAGNOSIS — F3289 Other specified depressive episodes: Secondary | ICD-10-CM

## 2019-08-30 DIAGNOSIS — G4719 Other hypersomnia: Secondary | ICD-10-CM

## 2019-08-30 DIAGNOSIS — Z9189 Other specified personal risk factors, not elsewhere classified: Secondary | ICD-10-CM

## 2019-08-30 DIAGNOSIS — E66813 Obesity, class 3: Secondary | ICD-10-CM

## 2019-08-31 MED ORDER — TOPIRAMATE 50 MG PO TABS: 50.0000 mg | ORAL_TABLET | Freq: Every day | ORAL | 0 refills | Status: DC

## 2019-08-31 MED ORDER — ESOMEPRAZOLE MAGNESIUM 40 MG PO CPDR: 40.0000 mg | DELAYED_RELEASE_CAPSULE | Freq: Every day | ORAL | 0 refills | Status: DC

## 2019-08-31 MED ORDER — METFORMIN HCL 500 MG PO TABS: 500.0000 mg | ORAL_TABLET | Freq: Every day | ORAL | 0 refills | Status: DC

## 2019-08-31 NOTE — Progress Notes (Signed)
Chief Complaint:   OBESITY Marianne is here to discuss her progress with her obesity treatment plan along with follow-up of her obesity related diagnoses. Esmeralda is on the [employer] and states she is following her eating plan approximately 100% of the time. Vandella states she is walking for 30 minutes 3 times per week.  Today's visit was #: 2 Starting weight: 232 lbs Starting date: 08/15/2019 Today's weight: 231 lbs Today's date: 08/30/2019 Total lbs lost to date: 1 lb Total lbs lost since last in-office visit: 1 lb  Interim History: Zuriel says she is getting sleep.  She has been doing integrative therapy for her legs.  She says she will be getting a recumbent bike soon.  She is working on stress reduction.  She says she received a steroid injection in her feet since last visit.  Still drinking wine.  Subjective:   1. Insulin resistance Tillie has a diagnosis of insulin resistance based on her elevated fasting insulin level >5. She continues to work on diet and exercise to decrease her risk of diabetes.  2. Excessive daytime sleepiness Epworth sleepiness score was 21 at last visit.  3. Dyspepsia She has been experiencing dyspepsia.   4. Other depression, with emotional eating Shauniece is struggling with emotional eating and using food for comfort to the extent that it is negatively impacting her health. She has been working on behavior modification techniques to help reduce her emotional eating and has been unsuccessful. She shows no sign of suicidal or homicidal ideations.  Assessment/Plan:   1. Insulin resistance Maycel will continue to work on weight loss, exercise, and decreasing simple carbohydrates to help decrease the risk of diabetes. Venecia agreed to follow-up with [place] as directed to closely monitor her progress.  Orders - metFORMIN (GLUCOPHAGE) 500 MG tablet; Take 1 tablet (500 mg total) by mouth daily with breakfast.  Dispense: 30 tablet; Refill: 0  2.  Excessive daytime sleepiness A referral has been placed to Sleep Medicine for evaluation.  Orders - Ambulatory referral to Sleep Studies  3. Dyspepsia Will start Shayne on Nexium for dyspepsia, as per below.  Orders - esomeprazole (NEXIUM) 40 MG capsule; Take 1 capsule (40 mg total) by mouth daily at 12 noon.  Dispense: 30 capsule; Refill: 0  4. Other depression, with emotional eating Behavior modification techniques were discussed today to help Runette deal with her emotional/non-hunger eating behaviors.  Orders and follow up as documented in patient record.   Orders - topiramate (TOPAMAX) 50 MG tablet; Take 1 tablet (50 mg total) by mouth daily with supper.  Dispense: 30 tablet; Refill: 0  5. At risk for heart disease Nyiesha was given approximately 15 minutes of coronary artery disease prevention counseling today. She is 43 y.o. female and has risk factors for heart disease including obesity. We discussed intensive lifestyle modifications today with an emphasis on specific weight loss instructions and strategies.   Repetitive spaced learning was employed today to elicit superior memory formation and behavioral change.  6. Class 3 severe obesity with serious comorbidity and body mass index (BMI) of 40.0 to 44.9 in adult, unspecified obesity type (HCC)  Tahja is currently in the action stage of change. As such, her goal is to continue with weight loss efforts. She has agreed to the [employer].   Exercise goals: For substantial health benefits, adults should do at least 150 minutes (2 hours and 30 minutes) a week of moderate-intensity, or 75 minutes (1 hour and 15 minutes) a  week of vigorous-intensity aerobic physical activity, or an equivalent combination of moderate- and vigorous-intensity aerobic activity. Aerobic activity should be performed in episodes of at least 10 minutes, and preferably, it should be spread throughout the week.  Behavioral modification strategies:  increasing lean protein intake and decreasing alcohol intake.  Libertie has agreed to follow-up with our clinic in 2-3 weeks. She was informed of the importance of frequent follow-up visits to maximize her success with intensive lifestyle modifications for her multiple health conditions.   Objective:   Blood pressure 110/73, pulse 95, temperature 98 F (36.7 C), temperature source Oral, height 5\' 1"  (1.549 m), weight 231 lb (104.8 kg), SpO2 99 %. Body mass index is 43.65 kg/m.  General: Cooperative, alert, well developed, in no acute distress. HEENT: Conjunctivae and lids unremarkable. Cardiovascular: Regular rhythm.  Lungs: Normal work of breathing. Neurologic: No focal deficits.   Lab Results  Component Value Date   CREATININE 0.80 08/08/2013   BUN 15 08/08/2013   NA 141 08/08/2013   K 4.1 08/08/2013   CL 104 08/08/2013   CO2 25 08/08/2013   Lab Results  Component Value Date   ALT 17 09/30/2011   AST 16 09/30/2011   ALKPHOS 80 09/30/2011   BILITOT 0.2 (L) 09/30/2011   Lab Results  Component Value Date   WBC 9.3 08/08/2013   HGB 12.2 08/08/2013   HCT 37.8 08/08/2013   MCV 82.9 08/08/2013   PLT 317 08/08/2013   Attestation Statements:   Reviewed by clinician on day of visit: allergies, medications, problem list, medical history, surgical history, family history, social history, and previous encounter notes.  I, 08/10/2013, CMA, am acting as transcriptionist for Insurance claims handler, DO  I have reviewed the above documentation for accuracy and completeness, and I agree with the above. Helane Rima, DO

## 2019-09-01 ENCOUNTER — Telehealth: Payer: Self-pay

## 2019-09-01 NOTE — Telephone Encounter (Signed)
Received VOB for pt today Both insurance requires Prior auth This was completed and faxed for both [employer] and [employer] today. Waiting on response from insurance

## 2019-09-02 LAB — COMPREHENSIVE METABOLIC PANEL
ALT: 10 IU/L (ref 0–32)
AST: 11 IU/L (ref 0–40)
Albumin/Globulin Ratio: 1.4 (ref 1.2–2.2)
Albumin: 3.9 g/dL (ref 3.8–4.8)
Alkaline Phosphatase: 83 IU/L (ref 48–121)
BUN/Creatinine Ratio: 15 (ref 9–23)
BUN: 13 mg/dL (ref 6–24)
Bilirubin Total: <0.2 mg/dL (ref 0.0–1.2)
CO2: 20 mmol/L (ref 20–29)
Calcium: 8.9 mg/dL (ref 8.7–10.2)
Chloride: 107 mmol/L — ABNORMAL HIGH (ref 96–106)
Creatinine, Ser: 0.86 mg/dL (ref 0.57–1.00)
GFR calc Af Amer: 96 mL/min/{1.73_m2} (ref >59)
GFR calc non Af Amer: 84 mL/min/{1.73_m2} (ref >59)
Globulin, Total: 2.8 g/dL (ref 1.5–4.5)
Glucose: 86 mg/dL (ref 65–99)
Potassium: 4.5 mmol/L (ref 3.5–5.2)
Sodium: 139 mmol/L (ref 134–144)
Total Protein: 6.7 g/dL (ref 6.0–8.5)

## 2019-09-02 LAB — CBC WITH DIFFERENTIAL/PLATELET
Basophils Absolute: 0 10*3/uL (ref 0.0–0.2)
Basos: 0 %
EOS (ABSOLUTE): 0.1 10*3/uL (ref 0.0–0.4)
Eos: 2 %
Hemoglobin: 12.8 g/dL (ref 11.1–15.9)
Immature Grans (Abs): 0 10*3/uL (ref 0.0–0.1)
Immature Granulocytes: 0 %
Lymphocytes Absolute: 1.8 10*3/uL (ref 0.7–3.1)
Lymphs: 21 %
MCH: 27.4 pg (ref 26.6–33.0)
MCHC: 33.2 g/dL (ref 31.5–35.7)
MCV: 82 fL (ref 79–97)
Monocytes Absolute: 0.6 10*3/uL (ref 0.1–0.9)
Monocytes: 7 %
Neutrophils Absolute: 6 10*3/uL (ref 1.4–7.0)
Neutrophils: 70 %
Platelets: 364 10*3/uL (ref 150–450)
RBC: 4.67 x10E6/uL (ref 3.77–5.28)
RDW: 12.4 % (ref 11.7–15.4)
WBC: 8.6 10*3/uL (ref 3.4–10.8)

## 2019-09-02 LAB — LIPID PANEL WITH LDL/HDL RATIO
Cholesterol, Total: 198 mg/dL (ref 100–199)
HDL: 52 mg/dL (ref >39)
LDL Chol Calc (NIH): 132 mg/dL — ABNORMAL HIGH (ref 0–99)
LDL/HDL Ratio: 2.5 ratio (ref 0.0–3.2)
Triglycerides: 79 mg/dL (ref 0–149)
VLDL Cholesterol Cal: 14 mg/dL (ref 5–40)

## 2019-09-02 LAB — H PYLORI, IGM, IGG, IGA AB
H pylori, IgM Abs: <9 units (ref 0.0–8.9)
H. pylori, IgA Abs: <9 units (ref 0.0–8.9)
H. pylori, IgG AbS: 0.3 Index Value (ref 0.00–0.79)

## 2019-09-02 LAB — TSH: TSH: 1.08 u[IU]/mL (ref 0.450–4.500)

## 2019-09-02 LAB — ANEMIA PANEL
Ferritin: 49 ng/mL (ref 15–150)
Folate, Hemolysate: 389 ng/mL
Folate, RBC: 1010 ng/mL (ref >498)
Hematocrit: 38.5 % (ref 34.0–46.6)
Iron Saturation: 19 % (ref 15–55)
Iron: 50 ug/dL (ref 27–159)
Retic Ct Pct: 1 % (ref 0.6–2.6)
Total Iron Binding Capacity: 268 ug/dL (ref 250–450)
UIBC: 218 ug/dL (ref 131–425)
Vitamin B-12: 699 pg/mL (ref 232–1245)

## 2019-09-02 LAB — T3: T3, Total: 113 ng/dL (ref 71–180)

## 2019-09-02 LAB — T4, FREE: Free T4: 1.27 ng/dL (ref 0.82–1.77)

## 2019-09-02 LAB — HEMOGLOBIN A1C
Est. average glucose Bld gHb Est-mCnc: 111 mg/dL
Hgb A1c MFr Bld: 5.5 % (ref 4.8–5.6)

## 2019-09-02 LAB — VITAMIN D 25 HYDROXY (VIT D DEFICIENCY, FRACTURES): Vit D, 25-Hydroxy: 16.8 ng/mL — ABNORMAL LOW (ref 30.0–100.0)

## 2019-09-02 LAB — INSULIN, RANDOM: INSULIN: 28.6 u[IU]/mL — ABNORMAL HIGH (ref 2.6–24.9)

## 2019-09-11 NOTE — Telephone Encounter (Signed)
Received authorization from [employer] Still waiting on [employer]

## 2019-09-12 NOTE — Telephone Encounter (Signed)
Approved for Synvisc one-Bilateral knee Dr. Juluis Pitch and Bill $30 copay then covered @ 100% Prior auth required with both [facility] auth # [id number] Dates: 09/01/2019-08/30/2020 [place] Numbers # [id number] Dates: 09/11/19-03/13/20

## 2019-09-12 NOTE — Telephone Encounter (Signed)
Called pt and scheduled her for Monday for injection Pt is aware of copay and stated understanding

## 2019-09-18 ENCOUNTER — Telehealth: Payer: Self-pay | Admitting: Orthopedic Surgery

## 2019-09-18 ENCOUNTER — Ambulatory Visit (INDEPENDENT_AMBULATORY_CARE_PROVIDER_SITE_OTHER): Payer: BC Managed Care – PPO | Admitting: Physician Assistant

## 2019-09-18 DIAGNOSIS — M1711 Unilateral primary osteoarthritis, right knee: Secondary | ICD-10-CM

## 2019-09-18 DIAGNOSIS — M170 Bilateral primary osteoarthritis of knee: Secondary | ICD-10-CM

## 2019-09-18 DIAGNOSIS — M1712 Unilateral primary osteoarthritis, left knee: Secondary | ICD-10-CM

## 2019-09-18 NOTE — Progress Notes (Signed)
Office Visit Note   Patient: Kimberly Wells           Date of Birth: [birth year]           MRN: [id number] Visit Date: 09/18/2019              Requested by: Mirna Mires, MD [address] [place],  [place] [address] PCP: Mirna Mires, MD  No chief complaint on file.     HPI: Patient comes in today for her bilateral Monovisc injections.  She has no new complaints  Assessment & Plan: Visit Diagnoses: No diagnosis found.  Plan: Bilateral knees were injected without difficulty patient will follow up as needed  Follow-Up Instructions: No follow-ups on file.   Ortho Exam  Patient is alert, oriented, no adenopathy, well-dressed, normal affect, normal respiratory effort. Focused examination of her knee she has no erythema no soft tissue swelling or effusion  Imaging: No results found. No images are attached to the encounter.  Labs: Lab Results  Component Value Date   HGBA1C 5.5 08/31/2019   REPTSTATUS 08/10/2013 FINAL 08/08/2013   CULT NO GROWTH Performed at [facility] 08/08/2013     Lab Results  Component Value Date   ALBUMIN 3.9 08/31/2019   ALBUMIN 3.4 (L) 09/30/2011   ALBUMIN 3.8 01/16/2009    No results found for: MG Lab Results  Component Value Date   VD25OH 16.8 (L) 08/31/2019    No results found for: PREALBUMIN CBC EXTENDED Latest Ref Rng & Units 08/31/2019 08/08/2013 09/30/2011  WBC 3.4 - 10.8 x10E3/uL 8.6 9.3 5.9  RBC 3.77 - 5.28 x10E6/uL 4.67 4.56 4.47  HGB 11.1 - 15.9 g/dL 25.4 27.0 11.9(L)  HCT 34.0 - 46.6 % 38.5 37.8 36.0  PLT 150 - 450 x10E3/uL 364 317 278  NEUTROABS 1 - 7 x10E3/uL 6.0 6.1 3.7  LYMPHSABS 0 - 3 x10E3/uL 1.8 2.2 1.5     There is no height or weight on file to calculate BMI.  Orders:  No orders of the defined types were placed in this encounter.  No orders of the defined types were placed in this encounter.    Procedures: Large Joint Inj: bilateral knee on 09/18/2019 4:46 PM Indications: pain and diagnostic  evaluation Details: 18 G 1.5 in needle, anterolateral approach  Arthrogram: No  Medications (Right): 88 mg Hyaluronan 88 MG/4ML Medications (Left): 88 mg Hyaluronan 88 MG/4ML Outcome: tolerated well, no immediate complications Procedure, treatment alternatives, risks and benefits explained, specific risks discussed. Consent was given by the patient.      Clinical Data: No additional findings.  ROS:  All other systems negative, except as noted in the HPI. Review of Systems  Objective: Vital Signs: There were no vitals taken for this visit.  Specialty Comments:  No specialty comments available.  PMFS History: Patient Active Problem List   Diagnosis Date Noted  . Depression 12/19/2012   Past Medical History:  Diagnosis Date  . ADHD   . Anemia   . Anxiety   . Asthma   . B12 deficiency   . Depression   . Depression   . GERD (gastroesophageal reflux disease)   . Joint pain   . Kidney infection   . Lactose intolerance   . Lymphedema   . Multiple food allergies   . Sleep apnea   . Vitamin D deficiency     Family History  Problem Relation Age of Onset  . Depression Mother   . Anxiety disorder Mother   .  Hypertension Mother   . Hyperlipidemia Mother   . Heart disease Mother   . Obesity Mother   . Depression Father     No past surgical history on file. Social History   Occupational History  . Occupation: Buyer, retail  Tobacco Use  . Smoking status: Never Smoker  . Smokeless tobacco: Never Used  Substance and Sexual Activity  . Alcohol use: No  . Drug use: No  . Sexual activity: Never    Birth control/protection: I.U.D.

## 2019-09-18 NOTE — Telephone Encounter (Signed)
Patient request referral to Dr.Deveshwar.    Call back # [phone]

## 2019-09-19 MED ORDER — HYALURONAN 88 MG/4ML IX SOSY
88.0000 mg | PREFILLED_SYRINGE | INTRA_ARTICULAR | Status: AC | PRN
  Administered 2019-09-18: 88 mg via INTRA_ARTICULAR

## 2019-09-19 NOTE — Telephone Encounter (Signed)
Pt was to have arthritis panel but do not see that this lab work was completed in June. Do you wish to refer to Rheumatology anyway?

## 2019-09-20 NOTE — Telephone Encounter (Signed)
I called and lm on vm to advise that we can make the referral for rheumatology but she will need labs first. Can call for a nurse only visit and we can obtain those here or if she had them obtained at her PCP office and we just do not have access to those results then she can have them send them to [place] and we can forward with the referral. To call with questions or to make the appt for labs if needed.

## 2019-09-25 ENCOUNTER — Ambulatory Visit (INDEPENDENT_AMBULATORY_CARE_PROVIDER_SITE_OTHER): Payer: BC Managed Care – PPO | Admitting: Family Medicine

## 2019-09-28 ENCOUNTER — Other Ambulatory Visit (INDEPENDENT_AMBULATORY_CARE_PROVIDER_SITE_OTHER): Payer: Self-pay | Admitting: Family Medicine

## 2019-09-28 DIAGNOSIS — R1013 Epigastric pain: Secondary | ICD-10-CM

## 2019-09-28 DIAGNOSIS — E8881 Metabolic syndrome: Secondary | ICD-10-CM

## 2019-09-28 DIAGNOSIS — F3289 Other specified depressive episodes: Secondary | ICD-10-CM

## 2019-10-02 ENCOUNTER — Emergency Department (HOSPITAL_COMMUNITY): Payer: BC Managed Care – PPO

## 2019-10-02 ENCOUNTER — Other Ambulatory Visit: Payer: Self-pay

## 2019-10-02 ENCOUNTER — Encounter (HOSPITAL_COMMUNITY): Payer: Self-pay

## 2019-10-02 ENCOUNTER — Emergency Department (HOSPITAL_COMMUNITY)
Admission: EM | Admit: 2019-10-02 | Discharge: 2019-10-03 | Disposition: A | Discharge status: Home / Self Care | Payer: BC Managed Care – PPO | Attending: Emergency Medicine | Admitting: Emergency Medicine

## 2019-10-02 DIAGNOSIS — Z5321 Procedure and treatment not carried out due to patient leaving prior to being seen by health care provider: Secondary | ICD-10-CM | POA: Diagnosis not present

## 2019-10-02 DIAGNOSIS — K219 Gastro-esophageal reflux disease without esophagitis: Secondary | ICD-10-CM | POA: Insufficient documentation

## 2019-10-02 DIAGNOSIS — R0602 Shortness of breath: Secondary | ICD-10-CM | POA: Diagnosis not present

## 2019-10-02 LAB — BASIC METABOLIC PANEL
Anion gap: 8 (ref 5–15)
BUN: 15 mg/dL (ref 6–20)
CO2: 22 mmol/L (ref 22–32)
Calcium: 8.8 mg/dL — ABNORMAL LOW (ref 8.9–10.3)
Chloride: 107 mmol/L (ref 98–111)
Creatinine, Ser: 0.89 mg/dL (ref 0.44–1.00)
GFR calc Af Amer: >60 mL/min (ref >60)
GFR calc non Af Amer: >60 mL/min (ref >60)
Glucose, Bld: 113 mg/dL — ABNORMAL HIGH (ref 70–99)
Potassium: 3.6 mmol/L (ref 3.5–5.1)
Sodium: 137 mmol/L (ref 135–145)

## 2019-10-02 LAB — TROPONIN I (HIGH SENSITIVITY): Troponin I (High Sensitivity): <2 ng/L (ref <18)

## 2019-10-02 LAB — CBC
HCT: 42.2 % (ref 36.0–46.0)
Hemoglobin: 13.4 g/dL (ref 12.0–15.0)
MCH: 26.6 pg (ref 26.0–34.0)
MCHC: 31.8 g/dL (ref 30.0–36.0)
MCV: 83.9 fL (ref 80.0–100.0)
Platelets: 406 10*3/uL — ABNORMAL HIGH (ref 150–400)
RBC: 5.03 MIL/uL (ref 3.87–5.11)
RDW: 13.2 % (ref 11.5–15.5)
WBC: 8.8 10*3/uL (ref 4.0–10.5)
nRBC: 0 % (ref 0.0–0.2)

## 2019-10-02 NOTE — ED Triage Notes (Signed)
Pt sts shortness of breath and issues with her acid reflux for 3 weeks. Sts dyspnea on exertion.

## 2019-10-03 NOTE — ED Notes (Signed)
First attempt for vitals recheck pt currently not in lobby

## 2019-10-05 ENCOUNTER — Ambulatory Visit: Payer: BC Managed Care – PPO | Admitting: Allergy & Immunology

## 2019-10-06 ENCOUNTER — Other Ambulatory Visit: Payer: Self-pay

## 2019-10-06 ENCOUNTER — Emergency Department (HOSPITAL_BASED_OUTPATIENT_CLINIC_OR_DEPARTMENT_OTHER)
Admission: EM | Admit: 2019-10-06 | Discharge: 2019-10-06 | Disposition: A | Discharge status: Home / Self Care | Payer: BC Managed Care – PPO | Attending: Emergency Medicine | Admitting: Emergency Medicine

## 2019-10-06 ENCOUNTER — Emergency Department (HOSPITAL_BASED_OUTPATIENT_CLINIC_OR_DEPARTMENT_OTHER): Payer: BC Managed Care – PPO

## 2019-10-06 ENCOUNTER — Ambulatory Visit (INDEPENDENT_AMBULATORY_CARE_PROVIDER_SITE_OTHER): Payer: BC Managed Care – PPO | Admitting: Allergy

## 2019-10-06 ENCOUNTER — Encounter: Payer: Self-pay | Admitting: Allergy

## 2019-10-06 ENCOUNTER — Encounter (HOSPITAL_BASED_OUTPATIENT_CLINIC_OR_DEPARTMENT_OTHER): Payer: Self-pay | Admitting: *Deleted

## 2019-10-06 VITALS — BP 128/88 | HR 79 | Temp 98.3°F | Resp 16 | Ht 61.0 in | Wt 235.0 lb

## 2019-10-06 DIAGNOSIS — J4540 Moderate persistent asthma, uncomplicated: Secondary | ICD-10-CM

## 2019-10-06 DIAGNOSIS — R2243 Localized swelling, mass and lump, lower limb, bilateral: Secondary | ICD-10-CM | POA: Diagnosis not present

## 2019-10-06 DIAGNOSIS — Z79899 Other long term (current) drug therapy: Secondary | ICD-10-CM | POA: Insufficient documentation

## 2019-10-06 DIAGNOSIS — J3089 Other allergic rhinitis: Secondary | ICD-10-CM | POA: Diagnosis not present

## 2019-10-06 DIAGNOSIS — J45909 Unspecified asthma, uncomplicated: Secondary | ICD-10-CM | POA: Diagnosis not present

## 2019-10-06 DIAGNOSIS — R109 Unspecified abdominal pain: Secondary | ICD-10-CM | POA: Diagnosis present

## 2019-10-06 DIAGNOSIS — K9049 Malabsorption due to intolerance, not elsewhere classified: Secondary | ICD-10-CM | POA: Diagnosis not present

## 2019-10-06 DIAGNOSIS — R0789 Other chest pain: Secondary | ICD-10-CM

## 2019-10-06 DIAGNOSIS — K219 Gastro-esophageal reflux disease without esophagitis: Secondary | ICD-10-CM | POA: Diagnosis not present

## 2019-10-06 DIAGNOSIS — K2100 Gastro-esophageal reflux disease with esophagitis, without bleeding: Secondary | ICD-10-CM | POA: Diagnosis not present

## 2019-10-06 LAB — URINALYSIS, ROUTINE W REFLEX MICROSCOPIC
Bilirubin Urine: NEGATIVE
Glucose, UA: NEGATIVE mg/dL
Ketones, ur: NEGATIVE mg/dL
Leukocytes,Ua: NEGATIVE
Nitrite: NEGATIVE
Protein, ur: NEGATIVE mg/dL
Specific Gravity, Urine: 1.02 (ref 1.005–1.030)
pH: 6 (ref 5.0–8.0)

## 2019-10-06 LAB — COMPREHENSIVE METABOLIC PANEL
ALT: 16 U/L (ref 0–44)
AST: 14 U/L — ABNORMAL LOW (ref 15–41)
Albumin: 3.4 g/dL — ABNORMAL LOW (ref 3.5–5.0)
Alkaline Phosphatase: 77 U/L (ref 38–126)
Anion gap: 8 (ref 5–15)
BUN: 9 mg/dL (ref 6–20)
CO2: 22 mmol/L (ref 22–32)
Calcium: 8.7 mg/dL — ABNORMAL LOW (ref 8.9–10.3)
Chloride: 101 mmol/L (ref 98–111)
Creatinine, Ser: 0.75 mg/dL (ref 0.44–1.00)
GFR calc Af Amer: >60 mL/min (ref >60)
GFR calc non Af Amer: >60 mL/min (ref >60)
Glucose, Bld: 85 mg/dL (ref 70–99)
Potassium: 4.5 mmol/L (ref 3.5–5.1)
Sodium: 131 mmol/L — ABNORMAL LOW (ref 135–145)
Total Bilirubin: 0.5 mg/dL (ref 0.3–1.2)
Total Protein: 6.9 g/dL (ref 6.5–8.1)

## 2019-10-06 LAB — CBC
HCT: 38 % (ref 36.0–46.0)
Hemoglobin: 12.1 g/dL (ref 12.0–15.0)
MCH: 26.8 pg (ref 26.0–34.0)
MCHC: 31.8 g/dL (ref 30.0–36.0)
MCV: 84.1 fL (ref 80.0–100.0)
Platelets: 382 10*3/uL (ref 150–400)
RBC: 4.52 MIL/uL (ref 3.87–5.11)
RDW: 12.8 % (ref 11.5–15.5)
WBC: 11.9 10*3/uL — ABNORMAL HIGH (ref 4.0–10.5)
nRBC: 0 % (ref 0.0–0.2)

## 2019-10-06 LAB — TROPONIN I (HIGH SENSITIVITY)
Troponin I (High Sensitivity): 3 ng/L (ref <18)
Troponin I (High Sensitivity): 3 ng/L (ref <18)

## 2019-10-06 LAB — PREGNANCY, URINE: Preg Test, Ur: NEGATIVE

## 2019-10-06 LAB — DIFFERENTIAL
Abs Immature Granulocytes: 0.03 10*3/uL (ref 0.00–0.07)
Basophils Absolute: 0 10*3/uL (ref 0.0–0.1)
Basophils Relative: 0 %
Eosinophils Absolute: 0.1 10*3/uL (ref 0.0–0.5)
Eosinophils Relative: 1 %
Immature Granulocytes: 0 %
Lymphocytes Relative: 12 %
Lymphs Abs: 1.4 10*3/uL (ref 0.7–4.0)
Monocytes Absolute: 0.7 10*3/uL (ref 0.1–1.0)
Monocytes Relative: 6 %
Neutro Abs: 9.6 10*3/uL — ABNORMAL HIGH (ref 1.7–7.7)
Neutrophils Relative %: 81 %

## 2019-10-06 LAB — LIPASE, BLOOD: Lipase: 23 U/L (ref 11–51)

## 2019-10-06 LAB — URINALYSIS, MICROSCOPIC (REFLEX)

## 2019-10-06 LAB — D-DIMER, QUANTITATIVE: D-Dimer, Quant: 1.4 ug/mL-FEU — ABNORMAL HIGH (ref 0.00–0.50)

## 2019-10-06 MED ORDER — OMEPRAZOLE 20 MG PO CPDR: 20.0000 mg | DELAYED_RELEASE_CAPSULE | Freq: Every day | ORAL | 0 refills | Status: DC

## 2019-10-06 MED ORDER — ALUM & MAG HYDROXIDE-SIMETH 200-200-20 MG/5ML PO SUSP
30.0000 mL | Freq: Once | ORAL | Status: AC
  Administered 2019-10-06: 30 mL via ORAL
  Filled 2019-10-06: qty 30

## 2019-10-06 MED ORDER — PANTOPRAZOLE SODIUM 40 MG IV SOLR
40.0000 mg | Freq: Once | INTRAVENOUS | Status: AC
  Administered 2019-10-06: 40 mg via INTRAVENOUS
  Filled 2019-10-06: qty 40

## 2019-10-06 MED ORDER — IOHEXOL 350 MG/ML SOLN
100.0000 mL | Freq: Once | INTRAVENOUS | Status: AC | PRN
  Administered 2019-10-06: 74 mL via INTRAVENOUS

## 2019-10-06 MED ORDER — MORPHINE SULFATE (PF) 4 MG/ML IV SOLN
4.0000 mg | Freq: Once | INTRAVENOUS | Status: AC
  Administered 2019-10-06: 4 mg via INTRAVENOUS
  Filled 2019-10-06: qty 1

## 2019-10-06 MED ORDER — SODIUM CHLORIDE 0.9 % IV BOLUS
1000.0000 mL | Freq: Once | INTRAVENOUS | Status: AC
  Administered 2019-10-06: 1000 mL via INTRAVENOUS

## 2019-10-06 MED ORDER — SUCRALFATE 1 G PO TABS: 1.0000 g | ORAL_TABLET | Freq: Three times a day (TID) | ORAL | 0 refills | Status: DC

## 2019-10-06 MED ORDER — LIDOCAINE VISCOUS HCL 2 % MT SOLN
15.0000 mL | Freq: Once | OROMUCOSAL | Status: AC
  Administered 2019-10-06: 15 mL via ORAL
  Filled 2019-10-06: qty 15

## 2019-10-06 MED ORDER — BUDESONIDE-FORMOTEROL FUMARATE 160-4.5 MCG/ACT IN AERO
2.0000 | INHALATION_SPRAY | Freq: Two times a day (BID) | RESPIRATORY_TRACT | 5 refills | Status: DC
Start: 2019-10-06 — End: 2023-04-15

## 2019-10-06 MED ORDER — OXYCODONE-ACETAMINOPHEN 5-325 MG PO TABS
1.0000 | ORAL_TABLET | Freq: Once | ORAL | Status: AC
  Administered 2019-10-06: 1 via ORAL
  Filled 2019-10-06: qty 1

## 2019-10-06 MED ORDER — DICYCLOMINE HCL 10 MG PO CAPS
10.0000 mg | ORAL_CAPSULE | Freq: Once | ORAL | Status: AC
  Administered 2019-10-06: 10 mg via ORAL
  Filled 2019-10-06: qty 1

## 2019-10-06 MED ORDER — HYDROCODONE-ACETAMINOPHEN 5-325 MG PO TABS: 1.0000 | ORAL_TABLET | Freq: Four times a day (QID) | ORAL | 0 refills | Status: DC | PRN

## 2019-10-06 MED ORDER — ONDANSETRON HCL 4 MG/2ML IJ SOLN
4.0000 mg | Freq: Once | INTRAMUSCULAR | Status: AC
  Administered 2019-10-06: 4 mg via INTRAVENOUS
  Filled 2019-10-06: qty 2

## 2019-10-06 NOTE — ED Provider Notes (Signed)
Ultrasound ED Peripheral IV (Provider)  Date/Time: 10/06/2019 5:54 PM Performed by: Milagros Loll, MD Authorized by: Milagros Loll, MD   Procedure details:    Indications: multiple failed IV attempts and poor IV access     Skin Prep: chlorhexidine gluconate     Location:  Left AC   Angiocath:  20 G   Bedside Ultrasound Guided: Yes     Images: archived     Patient tolerated procedure without complications: Yes     Dressing applied: Yes        Milagros Loll, MD 10/06/19 1755

## 2019-10-06 NOTE — Progress Notes (Signed)
New Patient Note  RE: Kimberly Wells MRN: [id number] DOB: Mar 04, 1975 Date of Office Visit: 10/06/2019  Referring provider: Helane Rima, DO Primary care provider: Maryagnes Amos, FNP  Chief Complaint: gerd, asthma  History of present illness: Kimberly Wells is a 44 y.o. female presenting today for consultation for asthma and reflux.    She states she has been having issues with reflux.  She also states her asthma has been uncontrolled as well.  She feels like her worsening reflux is impacting her asthma.  She states it is making her breathe harder and feels she is not getting rest at night.  Reflux and asthma symptoms are worse at night when she is laying down.  She does have an albuterol inhaler at home and states has been using it over last year about twice a week but she doesn't feel it to be helping symptoms.  She has an Advair diskus at home but states she is not compliant with use and has for about a year but has not used it.   She also has been having chest pain that radiates to back and shoulder and saw GI doctor yesterday at [facility].  She has an endoscopy planned for next Thursday.  She has changed her diet in last few months and does not eat pork and beef anymore.  She states lately she has been on a liquid diet as she feels now that food is getting stuck in her throat.   Foods that worsen her reflux include greasy foods, beef, red pasta sauce (tomato sauce), [place] foods.     She takes prilosec daily and it is not working at all.  She used to take nexium a long time ago.   She takes pepcid "all day" and she also uses gaviscon.  She states she is taking antacids "like candy".   She sleeps inclined at this time.  She states she is awaiting a return call from the GI doctor office in regards to medication changes she can make for better symptom control.  She does have eczema and states has improved with cutting a lot of foods out of her diet as above.     She had COVID illness in Nov 2020.   She is a former pt of the practice seeing Dr. Beaulah Dinning at the time about 10 years or so ago.  She states she had allergy testing done at that time.  She states she was positive to allergens on testing.  However she does not report any significant ocular, nasal or generalized allergy symptoms at this time.  She did have a recent ED visit on 2019-10-02 for symptoms as above however she states the wait was going to be more than 12 h and that she did leave.  Before leaving she did have a chest x-ray done that showed some mild atelectasis in the lung bases and she had normal troponin, CBC and BMP.  Review of systems: Review of Systems  Constitutional: Negative.   HENT: Negative.   Eyes: Negative.   Respiratory: Positive for shortness of breath.   Cardiovascular: Positive for chest pain.  Gastrointestinal: Positive for heartburn.  Musculoskeletal: Positive for back pain.  Skin: Negative.   Neurological: Negative.     All other systems negative unless noted above in HPI  Past medical history: Past Medical History:  Diagnosis Date  . ADHD   . Anemia   . Anxiety   . Asthma   . B12 deficiency   .  Depression   . Depression   . Eczema   . GERD (gastroesophageal reflux disease)   . Joint pain   . Kidney infection   . Lactose intolerance   . Lymphedema   . Multiple food allergies   . Sleep apnea   . Vitamin D deficiency     Past surgical history: Past Surgical History:  Procedure Laterality Date  . TONSILLECTOMY      Family history:  Family History  Problem Relation Age of Onset  . Depression Mother   . Anxiety disorder Mother   . Hypertension Mother   . Hyperlipidemia Mother   . Heart disease Mother   . Obesity Mother   . Depression Father     Social history: She lives in an apartment with carpeting.  No pets in the home.  She states there is no concern for water damage or mildew in the home.  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  She denies a smoking history.  Medication List: Current Outpatient Medications  Medication Sig Dispense Refill  . Cholecalciferol 25 MCG (1000 UT) tablet Take by mouth.     . dexmethylphenidate (FOCALIN XR) 10 MG 24 hr capsule Take 20 mg by mouth daily.    Marland Kitchen esomeprazole (NEXIUM) 40 MG capsule TAKE 1 CAPSULE (40 MG TOTAL) BY MOUTH DAILY AT 12 NOON. 30 capsule 0  . ferrous sulfate 325 (65 FE) MG EC tablet Take by mouth.    . furosemide (LASIX) 20 MG tablet Take 10 mg by mouth daily.     . meloxicam (MOBIC) 7.5 MG tablet Take 7.5 mg by mouth daily.    . metFORMIN (GLUCOPHAGE) 500 MG tablet TAKE 1 TABLET BY MOUTH EVERY DAY WITH BREAKFAST 30 tablet 0  . topiramate (TOPAMAX) 50 MG tablet TAKE 1 TABLET (50 MG TOTAL) BY MOUTH DAILY WITH SUPPER. 30 tablet 0   No current facility-administered medications for this visit.    Known medication allergies: No Known Allergies   Physical examination: Blood pressure 128/88, pulse 79, temperature 98.3 F (36.8 C), temperature source Temporal, resp. rate 16, height 5\' 1"  (1.549 m), weight 235 lb (106.6 kg), SpO2 98 %.  General: Alert, interactive, in no acute distress. HEENT: PERRLA, TMs pearly gray, turbinates minimally edematous without discharge, post-pharynx non erythematous. Neck: Supple without lymphadenopathy. Lungs: Clear to auscultation without wheezing, rhonchi or rales. {no increased work of breathing. CV: Normal S1, S2 without murmurs. Abdomen: Nondistended, nontender. Skin: Warm and dry, without lesions or rashes. Extremities:  No clubbing, cyanosis or edema. Neuro:   Grossly intact.  Diagnositics/Labs: Labs/imaging: Chest x-ray from 2019-10-02 personally reviewed. Impression:Mild linear atelectasis is seen within the left lung base. There is no evidence of a pleural effusion or pneumothorax. The heart size and mediastinal contours are within  normal limits. The visualized skeletal structures are unremarkable.  Spirometry: FEV1: 1.39 L 62%, FVC: 3.8 L 140% predicted status post albuterol she had increase in FEV1 to 1.62 L or 72%.  Allergy testing: Environmental allergy skin prick testing is positive to hickory, Aspergillus, phoma betae, Candida albicans. Food allergy skin prick testing is positive to almond. Allergy testing results were read and interpreted by provider, documented by clinical staff.   Assessment and plan: GERD Moderate persistent asthma Food intolerance Allergic rhinitis    - try Dexilant 60mg  1 tab daily for next 2 weeks until further recommendations by your GI doctor.   - continue Pepcid  20mg  1 tab twice a day  - continue to avoid known foods that worsen reflux symptoms  - continue lifestyle modifications and use   - agree with endoscopy and further work-up  - food allergy testing today is positive to almond.  Discussed trial of almond products out of diet to see if this improves symptoms.  See below for food categories.  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  Allergen avoidance measures discussed/handouts provided  - lung function testing today shows an obstructive process which is consistent with asthma  - have access to albuterol inhaler 2 puffs every 4-6 hours as needed for cough/wheeze/shortness of breath/chest tightness.  May use 15-20 minutes prior to activity.   Monitor frequency of use.    - start Symbicort 2 puffs twice a day  - use inhalers with spacer device  - for allergy symptom control can use antihistamine like Zyrtec 10mg , Xyzal 5mg  or Allegra 180mg  daily as needed  Asthma control goals:   Full participation in all desired activities (may need albuterol before activity)  Albuterol use two time or less a week on average (not counting use with activity)  Cough interfering with sleep two time or less a month  Oral steroids no more than  once a year  No hospitalizations  - we have discussed the following in regards to foods:   Allergy: food allergy is when you have eaten a food, developed an allergic reaction after eating the food and have IgE to the food (positive food testing either by skin testing or blood testing).  Food allergy could lead to life threatening symptoms  Sensitivity: occurs when you have IgE to a food (positive food testing either by skin testing or blood testing) but is a food you eat without any issues.  This is not an allergy and we recommend keeping the food in the diet  Intolerance: this is when you have negative testing by either skin testing or blood testing thus not allergic but the food causes symptoms (like belly pain, bloating, diarrhea etc) with ingestion.  These foods should be avoided to prevent symptoms.  .  Follow-up in 3 months or sooner if needed  I appreciate the opportunity to take part in Claudina's care. Please do not hesitate to contact me with questions.  Sincerely,   , MD Allergy/Immunology [facility]

## 2019-10-06 NOTE — ED Provider Notes (Signed)
[facility] EMERGENCY DEPARTMENT Provider Note   CSN: [id number] Arrival date & time: 10/06/19  1322     History Chief Complaint  Patient presents with   Abdominal Pain    Kimberly Wells is a 44 y.o. female with PMH/o ADHD, Depression, lymphedema, GERD, Asthma who presents for evaluation of central chest pain, shoulder pain.  She reports that initially, her symptoms started about 3 weeks ago.  She states that at onset, it felt similar to her previous episodes of GERD and reported that she would have a reflux, "burning" sensation in the midsternal area of her chest.  She states that she had been on omeprazole but has not been on it recently.  She has been taking over-the-counter antacids and Pepcid to help with her symptoms.  She reports that now she has to take those medications multiple times a day.  She felt like over the last week, the symptoms got worse.  She states that the pain starts in the upper epigastric region and extends midsternally.  She states it also feels like a burning into her chest and up into her right shoulder.  She states her abdomen feels bloated but she is not having pain.  She went to the ED on 10/02/2019 but did not wait to be seen.  She followed up with her asthma doctor the next day.  She was noted to have atelectasis on a chest x-ray and was prescribed Dexilant and Symbicort.  She was [facility] to [facility].  She states that she saw them and they did not add any other medications but reported that she would have an ultrasound and endoscopy scheduled.  She states that she has not had confirmation of when that would be but she was in the emergency department today because pain is progressively worsening.  She feels like "a hot poker is sticking me in my chest all the way up and is burning."  She states that this goes to her shoulder and to her neck.  She has that this pain is worse with deep inspiration.  She states it also is worse  with certain movements and will randomly get worse.  It is not worse with exertion.  She does feel like at times, the pain will cause her to catch her breath.  She states that she feels like her "esophagus is all up and knots."  She states that eating worsens this pain and she has had to just do liquids because she feels like swallowing is making it worse.  She has not noted any fevers, dysuria, hematuria.  Her last BM was today and was normal. She does not smoke.  She states she has very infrequent use of alcohol.  She does not frequently use NSAIDs. She denies any OCP use, recent immobilization, prior history of DVT/PE, recent surgery, or long travel.  She has chronic leg swelling secondary to lymphedema which she states is normal for her.  She denies any personal cardiac history.  She reports her great grandfather may have had heart attack before the age of 43.  No other family history.  She has no history of hypertension or diabetes.  The history is provided by the patient.       Past Medical History:  Diagnosis Date   ADHD    Anemia    Anxiety    Asthma    B12 deficiency    Depression    Depression    Eczema  GERD (gastroesophageal reflux disease)    Joint pain    Kidney infection    Lactose intolerance    Lymphedema    Multiple food allergies    Sleep apnea    Vitamin D deficiency     Patient Active Problem List   Diagnosis Date Noted   Depression 12/19/2012    Past Surgical History:  Procedure Laterality Date   TONSILLECTOMY       OB History    Gravida  3   Para      Term      Preterm      AB      Living  1     SAB      TAB      Ectopic      Multiple      Live Births              Family History  Problem Relation Age of Onset   Depression Mother    Anxiety disorder Mother    Hypertension Mother    Hyperlipidemia Mother    Heart disease Mother    Obesity Mother    Depression Father     Social History    Tobacco Use   Smoking status: Never Smoker   Smokeless tobacco: Never Used  Substance Use Topics   Alcohol use: No   Drug use: No    Home Medications Prior to Admission medications   Medication Sig Start Date End Date Taking? Authorizing Provider  budesonide-formoterol (SYMBICORT) 160-4.5 MCG/ACT inhaler Inhale 2 puffs into the lungs 2 (two) times daily. 10/06/19   Marcelyn Bruins, MD  Cholecalciferol 25 MCG (1000 UT) tablet Take by mouth.  11/01/18 11/01/19  [provider]  dexmethylphenidate (FOCALIN XR) 10 MG 24 hr capsule Take 20 mg by mouth daily. 06/19/19   [provider]  esomeprazole (NEXIUM) 40 MG capsule TAKE 1 CAPSULE (40 MG TOTAL) BY MOUTH DAILY AT 12 NOON. 10/02/19   Helane Rima, DO  ferrous sulfate 325 (65 FE) MG EC tablet Take by mouth. 10/27/18   [provider]  furosemide (LASIX) 20 MG tablet Take 10 mg by mouth daily.  10/27/18   [provider]  HYDROcodone-acetaminophen (NORCO/VICODIN) 5-325 MG tablet Take 1-2 tablets by mouth every 6 (six) hours as needed. 10/06/19   Maxwell Caul, PA-C  meloxicam (MOBIC) 7.5 MG tablet Take 7.5 mg by mouth daily. 08/15/19   [provider]  metFORMIN (GLUCOPHAGE) 500 MG tablet TAKE 1 TABLET BY MOUTH EVERY DAY WITH BREAKFAST 10/02/19   Helane Rima, DO  omeprazole (PRILOSEC) 20 MG capsule Take 1 capsule (20 mg total) by mouth daily. 10/06/19 11/05/19  Maxwell Caul, PA-C  sucralfate (CARAFATE) 1 g tablet Take 1 tablet (1 g total) by mouth 4 (four) times daily -  with meals and at bedtime for 21 days. 10/06/19 10/27/19  Maxwell Caul, PA-C  topiramate (TOPAMAX) 50 MG tablet TAKE 1 TABLET (50 MG TOTAL) BY MOUTH DAILY WITH SUPPER. 10/02/19   Helane Rima, DO    Allergies    Patient has no known allergies.  Review of Systems   Review of Systems  Constitutional: Positive for appetite change. Negative for fever.  Respiratory: Negative for cough and shortness of breath.    Cardiovascular: Positive for chest pain and leg swelling.  Gastrointestinal: Positive for nausea. Negative for abdominal pain and vomiting.  Genitourinary: Negative for dysuria and hematuria.  Neurological: Negative for headaches.  All other systems reviewed  and are negative.   Physical Exam Updated Vital Signs BP 96/67 (BP Location: Right Arm)    Pulse 100    Temp 98.1 F (36.7 C) (Oral)    Resp 18    Ht 5\' 1"  (1.549 m)    Wt 48.4 kg    SpO2 100%    BMI 20.14 kg/m   Physical Exam Vitals and nursing note reviewed.  Constitutional:      Appearance: Normal appearance. She is well-developed.     Comments: Appears uncomfortable but NAD  HENT:     Head: Normocephalic and atraumatic.  Eyes:     General: Lids are normal.     Conjunctiva/sclera: Conjunctivae normal.     Pupils: Pupils are equal, round, and reactive to light.  Cardiovascular:     Rate and Rhythm: Normal rate and regular rhythm.     Pulses: Normal pulses.          Radial pulses are 2+ on the right side and 2+ on the left side.       Dorsalis pedis pulses are 2+ on the right side and 2+ on the left side.     Heart sounds: Normal heart sounds. No murmur heard.  No friction rub. No gallop.   Pulmonary:     Effort: Pulmonary effort is normal.     Breath sounds: Normal breath sounds.     Comments: Lungs clear to auscultation bilaterally.  Symmetric chest rise.  No wheezing, rales, rhonchi. Abdominal:     Palpations: Abdomen is soft. Abdomen is not rigid.     Tenderness: There is no abdominal tenderness. There is no right CVA tenderness, left CVA tenderness or guarding.     Comments: Abdomen is soft, non-distended, non-tender. No rigidity, No guarding. No peritoneal signs. No CVA tenderness.   Musculoskeletal:        General: Normal range of motion.     Cervical back: Full passive range of motion without pain.     Comments: BLE are symmetric in appearance without any overlying warmth, erythema.   Skin:    General: Skin  is warm and dry.     Capillary Refill: Capillary refill takes less than 2 seconds.  Neurological:     Mental Status: She is alert and oriented to person, place, and time.  Psychiatric:        Speech: Speech normal.     ED Results / Procedures / Treatments   Labs (all labs ordered are listed, but only abnormal results are displayed) Labs Reviewed  COMPREHENSIVE METABOLIC PANEL - Abnormal; Notable for the following components:      Result Value   Sodium 131 (*)    Calcium 8.7 (*)    Albumin 3.4 (*)    AST 14 (*)    All other components within normal limits  URINALYSIS, ROUTINE W REFLEX MICROSCOPIC - Abnormal; Notable for the following components:   Hgb urine dipstick TRACE (*)    All other components within normal limits  URINALYSIS, MICROSCOPIC (REFLEX) - Abnormal; Notable for the following components:   Bacteria, UA RARE (*)    All other components within normal limits  D-DIMER, QUANTITATIVE (NOT AT [facility]) - Abnormal; Notable for the following components:   D-Dimer, Quant 1.40 (*)    All other components within normal limits  CBC - Abnormal; Notable for the following components:   WBC 11.9 (*)    All other components within normal limits  DIFFERENTIAL - Abnormal; Notable for the following components:  Neutro Abs 9.6 (*)    All other components within normal limits  LIPASE, BLOOD  PREGNANCY, URINE  CBC WITH DIFFERENTIAL/PLATELET  TROPONIN I (HIGH SENSITIVITY)  TROPONIN I (HIGH SENSITIVITY)    EKG None  Radiology DG Chest 2 View  Result Date: 10/06/2019 CLINICAL DATA:  Chest pain. EXAM: CHEST - 2 VIEW COMPARISON:  October 02, 2019 FINDINGS: Mild, stable bibasilar atelectasis and/or early infiltrate is seen, left greater than right. There is no evidence of a pleural effusion or pneumothorax. The heart size and mediastinal contours are within normal limits. The visualized skeletal structures are unremarkable. IMPRESSION: Mild, stable bibasilar atelectasis and/or early  infiltrate, left greater than right. Electronically Signed   By: Aram Candela M.D.   On: 10/06/2019 15:34   CT Angio Chest PE W and/or Wo Contrast  Result Date: 10/06/2019 CLINICAL DATA:  Chest pain, dyspnea, elevated D-dimer EXAM: CT ANGIOGRAPHY CHEST WITH CONTRAST TECHNIQUE: Multidetector CT imaging of the chest was performed using the standard protocol during bolus administration of intravenous contrast. Multiplanar CT image reconstructions and MIPs were obtained to evaluate the vascular anatomy. CONTRAST:  62mL OMNIPAQUE IOHEXOL 350 MG/ML SOLN COMPARISON:  None FINDINGS: Cardiovascular: Satisfactory opacification of the pulmonary arteries to the segmental level. No evidence of pulmonary embolism. Normal heart size. No pericardial effusion. The thoracic aorta is unremarkable Mediastinum/Nodes: There is in insinuating mass within the a subcarinal middle mediastinum which demonstrates mild mass effect upon the left atrium and slight splaying of the carina. 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 There is no definite pathologic thoracic adenopathy. The esophagus is unremarkable. Thyroid unremarkable. Lungs/Pleura: Lungs are clear. No pleural effusion or pneumothorax. Upper Abdomen: No acute abnormality. Musculoskeletal: No chest wall abnormality. No acute or significant osseous findings. Review of the MIP images confirms the above findings. IMPRESSION: 1. No evidence of acute pulmonary embolism. 2. No acute abnormality in the chest. 3. 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 This is likely chronic in nature and unlikely to account for the patient's reported acute symptoms. Electronically Signed   By: Helyn Numbers MD   On: 10/06/2019 19:58   US Abdomen Limited RUQ  Result Date: 10/06/2019 CLINICAL DATA:  Upper abdominal pain x3 weeks. EXAM: ULTRASOUND  ABDOMEN LIMITED RIGHT UPPER QUADRANT COMPARISON:  None. FINDINGS: Gallbladder: The gallbladder is limited in evaluation secondary to overlying bowel gas. No gallstones or wall thickening visualized (2.1 mm). No sonographic Murphy sign noted by sonographer. Common bile duct: Diameter: 1.4 mm (limited in evaluation secondary to overlying bowel gas) Liver: No focal lesion identified. Within normal limits in parenchymal echogenicity. Portal vein is patent on color Doppler imaging with normal direction of blood flow towards the liver. Other: None. IMPRESSION: 1. Limited study secondary to overlying bowel gas. 2. No evidence of cholelithiasis or acute cholecystitis. Electronically Signed   By: Aram Candela M.D.   On: 10/06/2019 18:38    Procedures Procedures (including critical care time)  Medications Ordered in ED Medications  alum & mag hydroxide-simeth (MAALOX/MYLANTA) 200-200-20 MG/5ML suspension 30 mL (30 mLs Oral Given 10/06/19 1523)    And  lidocaine (XYLOCAINE) 2 % viscous mouth solution 15 mL (15 mLs Oral Given 10/06/19 1523)  dicyclomine (BENTYL) capsule 10 mg (10 mg Oral Given 10/06/19 1524)  sodium chloride 0.9 % bolus 1,000 mL ( Intravenous Stopped 10/06/19 1957)  pantoprazole (PROTONIX) injection 40 mg (40 mg Intravenous Given  10/06/19 1800)  morphine 4 MG/ML injection 4 mg (4 mg Intravenous Given 10/06/19 1756)  ondansetron (ZOFRAN) injection 4 mg (4 mg Intravenous Given 10/06/19 1756)  iohexol (OMNIPAQUE) 350 MG/ML injection 100 mL (74 mLs Intravenous Contrast Given 10/06/19 1914)  oxyCODONE-acetaminophen (PERCOCET/ROXICET) 5-325 MG per tablet 1 tablet (1 tablet Oral Given 10/06/19 2157)    ED Course  I have reviewed the triage vital signs and the nursing notes.  Pertinent labs & imaging results that were available during my care of the patient were reviewed by me and considered in my medical decision making (see chart for details).    MDM Rules/Calculators/A&P                           44 year old female who presents for evaluation of midsternal chest pain, right shoulder pain that has been ongoing for last 3 weeks and worsened last week.  History of GERD that is uncontrolled.  She saw GI a few days ago but was not given any medication.  She states that she was told she would need to get an EGD and ultrasound done but those have not gotten done yet.  She comes in today because pain is worsened.  She describes it as a burning pain in the midsternal of her chest.  It is worse with deep inspiration.  On initial arrival, she is afebrile, nontoxic-appearing.  She appears uncomfortable but no acute distress.  Vital signs are stable.  Abdomen exam is benign.  She is intermittently complaining of pain during the interview but otherwise is well-appearing.  Concern for esophageal spasm versus GERD versus intra-abdominal pathology.  Low suspicion for ACS etiology but is a consideration.  History/physical exam concerning for dissection.  Low suspicion for PE but she does report pleuritic chest pain and was noted to have atelectasis on her chest x-ray.  No risk factors.  Will obtain lab work, chest x-ray, give GI cocktail as well as PPI and reassess.  Troponin is negative.  Patient's pain has been consistent for the last several days.  Additionally, is atypical for ACS etiology.  She does not have any risk factors.  Troponin is sufficient for ACS rule out.  CBC shows slight leukocytosis of 11.9.  Otherwise unremarkable.  Lipase normal.  CMP shows normal LFTs.  No elevation in bili.  UA negative for any infectious etiology.  Urine pregnancy negative.  D-dimer is elevated at 1.40.  Chest x-ray shows mild stable bibasilar atelectasis and/or early infiltrate.   We will proceed with formal ultrasound of her right upper quadrant.  At this time, she does not exhibit any pain right upper quadrant but had been referred for possible right upper quadrant ultrasound for her pain.  Additionally, given her D-dimer,  will plan for CTA of chest for evaluation of PE.  Updated patient on plan.  She is agreeable.  She reports improvement in pain with pain medication.  Ultrasound shows no evidence of cholelithiasis or cholecystitis.  There is limited study secondary to overlying bowel gas but otherwise no abnormalities.  CT of chest shows no evidence of PE.  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  Per reading, this is likely chronic in nature and unlikely to account for patient's reported acute symptoms.  Discussed results with patient, including findings on CT scan.  Patient does report improvement in pain.  She still has some slight burning  that occasionally happens.  At this time, she is hemodynamically stable.  She has been able to tolerate p.o. in the department any difficulty.  No vomiting.  At this time, I suspect her symptoms are likely combo of GERD, esophageal spasm.  She has been referred to GI but has not seen them yet.  Will give additional outpatient GI referral.  I discussed with her regarding management of her pain and GERD symptoms.  We will start her on PPI as well as give her Carafate for symptomatic relief.  Patient reviewed on PMP.  Will give short course of pain medication for breakthrough pain until she can follow-up with GI. At this time, patient exhibits no emergent life-threatening condition that require further evaluation in ED or admission. Discussed patient with Dr. Stevie Kern. Patient had ample opportunity for questions and discussion. All patient's questions were answered with full understanding. Strict return precautions discussed. Patient expresses understanding and agreement to plan.   Portions of this note were generated with Scientist, clinical (histocompatibility and immunogenetics). Dictation errors may occur despite best attempts at proofreading.   Final Clinical Impression(s) / ED Diagnoses Final diagnoses:    Abdominal pain  Atypical chest pain  Gastroesophageal reflux disease, unspecified whether esophagitis present    Rx / DC Orders ED Discharge Orders         Ordered    sucralfate (CARAFATE) 1 g tablet  3 times daily with meals & bedtime        10/06/19 2126    omeprazole (PRILOSEC) 20 MG capsule  Daily        10/06/19 2126    HYDROcodone-acetaminophen (NORCO/VICODIN) 5-325 MG tablet  Every 6 hours PRN        10/06/19 2126           Maxwell Caul, PA-C 10/06/19 2324    Milagros Loll, MD 10/09/19 [phone]

## 2019-10-06 NOTE — Patient Instructions (Addendum)
 -   try Dexilant 60mg  1 tab daily for next 2 weeks until further recommendations by your GI doctor.   - continue Pepcid 20mg  1 tab twice a day  - continue to avoid known foods that worsen reflux symptoms  - continue lifestyle modifications and use   - agree with endoscopy and further work-up  - food allergy testing today is positive to almond.  Discussed trial of almond products out of diet to see if this improves symptoms.  See below for food categories.  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  Allergen avoidance measures discussed/handouts provided  - lung function testing today shows an obstructive process which is consistent with asthma  - have access to albuterol inhaler 2 puffs every 4-6 hours as needed for cough/wheeze/shortness of breath/chest tightness.  May use 15-20 minutes prior to activity.   Monitor frequency of use.    - start Symbicort 2 puffs twice a day  - use inhalers with spacer device  - for allergy symptom control can use antihistamine like Zyrtec 10mg , Xyzal 5mg  or Allegra 180mg  daily as needed  Asthma control goals:   Full participation in all desired activities (may need albuterol before activity)  Albuterol use two time or less a week on average (not counting use with activity)  Cough interfering with sleep two time or less a month  Oral steroids no more than once a year  No hospitalizations  - we have discussed the following in regards to foods:   Allergy: food allergy is when you have eaten a food, developed an allergic reaction after eating the food and have IgE to the food (positive food testing either by skin testing or blood testing).  Food allergy could lead to life threatening symptoms  Sensitivity: occurs when you have IgE to a food (positive food testing either by skin testing or blood testing) but is a food you eat without any issues.  This is not an allergy and we recommend keeping the food in the  diet  Intolerance: this is when you have negative testing by either skin testing or blood testing thus not allergic but the food causes symptoms (like belly pain, bloating, diarrhea etc) with ingestion.  These foods should be avoided to prevent symptoms.  .  Follow-up in 3 months or sooner if needed

## 2019-10-06 NOTE — ED Triage Notes (Signed)
Right upper quadrant pain. She was seen at [facility] yesterday and diagnosed with gallstones. This has not been confirmed with [place].

## 2019-10-06 NOTE — Discharge Instructions (Signed)
As we discussed, your work-up today was reassuring.  Your CT scan of your chest did not show any blood clots.  There was a mention of a small water cyst noted around your heart.  This does not appear to be contributing to your symptoms today.  I provided you with referral to cardiology for further evaluation.  Additionally, as we discussed, I suspect her symptoms may be related to GERD, may be esophageal spasm.  I provided you with medication to help with the pain.  You should take this medication as directed.  Take pain medications as directed for break through pain. Do not drive or operate machinery while taking this medication.   I have also provided you outpatient referral to GI.  You can follow-up with them.  Return the emergency department for any worsening pain, difficulty breathing, persistent vomiting or any other worsening or concerning symptoms.

## 2019-10-06 NOTE — ED Notes (Signed)
Unable to get IV access despite multiple attempts multiple staff. IV meds delayed.

## 2019-10-16 ENCOUNTER — Telehealth: Payer: Self-pay

## 2019-10-16 ENCOUNTER — Institutional Professional Consult (permissible substitution): Payer: BC Managed Care – PPO | Admitting: Neurology

## 2019-10-16 ENCOUNTER — Encounter: Payer: Self-pay | Admitting: Neurology

## 2019-10-16 NOTE — Telephone Encounter (Signed)
Patient no showed 10/16/2019 sleep consult with Dr. Frances Furbish.

## 2019-10-25 ENCOUNTER — Other Ambulatory Visit (INDEPENDENT_AMBULATORY_CARE_PROVIDER_SITE_OTHER): Payer: Self-pay | Admitting: Family Medicine

## 2019-10-25 DIAGNOSIS — E88819 Insulin resistance, unspecified: Secondary | ICD-10-CM

## 2019-10-25 DIAGNOSIS — E8881 Metabolic syndrome: Secondary | ICD-10-CM

## 2019-10-25 DIAGNOSIS — R1013 Epigastric pain: Secondary | ICD-10-CM

## 2019-10-25 DIAGNOSIS — F3289 Other specified depressive episodes: Secondary | ICD-10-CM

## 2019-11-13 ENCOUNTER — Institutional Professional Consult (permissible substitution): Payer: BC Managed Care – PPO | Admitting: Neurology

## 2019-11-13 NOTE — Telephone Encounter (Signed)
Pt no showed for sleep consult again on 11/13/19 with Dr. Frances Furbish. I informed the pt of our no show policy when I rescheduled her on 10/16/19. Pt verbalized understanding.  Do you wish to dismiss the pt at this time due to no show policy?

## 2019-11-13 NOTE — Telephone Encounter (Signed)
Please review and follow dismissal protocol as per our No Show Policy.   

## 2019-11-14 ENCOUNTER — Encounter: Payer: Self-pay | Admitting: Neurology

## 2020-01-03 ENCOUNTER — Ambulatory Visit: Payer: No Typology Code available for payment source | Admitting: Allergy

## 2020-03-11 ENCOUNTER — Ambulatory Visit (INDEPENDENT_AMBULATORY_CARE_PROVIDER_SITE_OTHER): Payer: 59 | Admitting: Orthopedic Surgery

## 2020-03-11 ENCOUNTER — Ambulatory Visit: Payer: Self-pay

## 2020-03-11 ENCOUNTER — Encounter: Payer: Self-pay | Admitting: Orthopedic Surgery

## 2020-03-11 ENCOUNTER — Ambulatory Visit (INDEPENDENT_AMBULATORY_CARE_PROVIDER_SITE_OTHER): Payer: 59

## 2020-03-11 DIAGNOSIS — M79671 Pain in right foot: Secondary | ICD-10-CM | POA: Diagnosis not present

## 2020-03-11 DIAGNOSIS — M79672 Pain in left foot: Secondary | ICD-10-CM

## 2020-03-11 DIAGNOSIS — M6701 Short Achilles tendon (acquired), right ankle: Secondary | ICD-10-CM | POA: Diagnosis not present

## 2020-03-11 DIAGNOSIS — M6702 Short Achilles tendon (acquired), left ankle: Secondary | ICD-10-CM

## 2020-03-11 NOTE — Progress Notes (Signed)
Office Visit Note   Patient: Kimberly Wells           Date of Birth: [birth year]           MRN: [id number] Visit Date: 03/11/2020              Requested by: Maryagnes Amos, FNP [address] [place],  [place] [address] PCP: Maryagnes Amos, FNP  Chief Complaint  Patient presents with  . Left Foot - Pain  . Right Foot - Pain      HPI: Patient is a 45 year old woman who presents complaining of bilateral heel pain right worse than left at the insertion of the Achilles.  Patient states that she is taking Mobic.  Patient complains of pain with activities of daily living pain with start up.  She has been followed by Dr. Spero Curb podiatry  Assessment & Plan: Visit Diagnoses:  1. Bilateral foot pain   2. Achilles tendon contracture, bilateral     Plan: Patient was given instructions and demonstrated Achilles stretching to do 5 times a day a minute at a time.  Follow-Up Instructions: No follow-ups on file.   Ortho Exam  Patient is alert, oriented, no adenopathy, well-dressed, normal affect, normal respiratory effort. Examination patient has a palpable pulse.  She has Achilles contracture worse on the left than the right with dorsiflexion about 10 degrees short of neutral on the right and about 20 degrees short of neutral on the left.  There are no plantar ulcers.  There are no palpable large Haglund deformity bone spurs no skin breakdown.  There is no tenderness to palpation over the posterior tibial tendon no evidence of posterior tibial tendon insufficiency.  Imaging: XR Foot 2 Views Left  Result Date: 03/11/2020 2 view radiographs of the left foot shows pes planus with a small Haglund's deformity and small plantar heel spur  XR Foot 2 Views Right  Result Date: 03/11/2020 2 view radiographs of the right foot shows pes planus with a small Haglund's deformity and small plantar heel spur.  No images are attached to the encounter.  Labs: Lab Results   Component Value Date   HGBA1C 5.5 08/31/2019   REPTSTATUS 08/10/2013 FINAL 08/08/2013   CULT NO GROWTH Performed at [facility] 08/08/2013     Lab Results  Component Value Date   ALBUMIN 3.4 (L) 10/06/2019   ALBUMIN 3.9 08/31/2019   ALBUMIN 3.4 (L) 09/30/2011    No results found for: MG Lab Results  Component Value Date   VD25OH 16.8 (L) 08/31/2019    No results found for: PREALBUMIN CBC EXTENDED Latest Ref Rng & Units 10/06/2019 10/02/2019 08/31/2019  WBC 4.0 - 10.5 K/uL 11.9(H) 8.8 8.6  RBC 3.87 - 5.11 MIL/uL 4.52 5.03 4.67  HGB 12.0 - 15.0 g/dL 32.9 51.8 84.1  HCT 66.0 - 46.0 % 38.0 42.2 38.5  PLT 150 - 400 K/uL 382 406(H) 364  NEUTROABS 1.7 - 7.7 K/uL 9.6(H) - 6.0  LYMPHSABS 0.7 - 4.0 K/uL 1.4 - 1.8     There is no height or weight on file to calculate BMI.  Orders:  Orders Placed This Encounter  Procedures  . XR Foot 2 Views Right  . XR Foot 2 Views Left   No orders of the defined types were placed in this encounter.    Procedures: No procedures performed  Clinical Data: No additional findings.  ROS:  All other systems negative, except as noted in the HPI. Review  of Systems  Objective: Vital Signs: There were no vitals taken for this visit.  Specialty Comments:  No specialty comments available.  PMFS History: Patient Active Problem List   Diagnosis Date Noted  . Depression 12/19/2012   Past Medical History:  Diagnosis Date  . ADHD   . Anemia   . Anxiety   . Asthma   . B12 deficiency   . Depression   . Depression   . Eczema   . GERD (gastroesophageal reflux disease)   . Joint pain   . Kidney infection   . Lactose intolerance   . Lymphedema   . Multiple food allergies   . Sleep apnea   . Vitamin D deficiency     Family History  Problem Relation Age of Onset  . Depression Mother   . Anxiety disorder Mother   . Hypertension Mother   . Hyperlipidemia Mother   . Heart disease Mother   . Obesity Mother   . Depression  Father     Past Surgical History:  Procedure Laterality Date  . TONSILLECTOMY     Social History   Occupational History  . Occupation: Buyer, retail  Tobacco Use  . Smoking status: Never Smoker  . Smokeless tobacco: Never Used  Substance and Sexual Activity  . Alcohol use: No  . Drug use: No  . Sexual activity: Never    Birth control/protection: I.U.D.

## 2020-04-02 ENCOUNTER — Other Ambulatory Visit: Payer: Self-pay | Admitting: Family Medicine

## 2020-04-02 DIAGNOSIS — Z1231 Encounter for screening mammogram for malignant neoplasm of breast: Secondary | ICD-10-CM

## 2020-07-30 ENCOUNTER — Ambulatory Visit: Payer: 59 | Admitting: Orthopedic Surgery

## 2020-08-12 ENCOUNTER — Telehealth: Payer: Self-pay | Admitting: Orthopedic Surgery

## 2020-08-12 NOTE — Telephone Encounter (Signed)
Patient called. She would like gel injection in her knees. Says her insurance will change 7/1. Her call back number is [phone]

## 2020-08-12 NOTE — Telephone Encounter (Signed)
Patient would like gel injections in her knees.

## 2020-08-12 NOTE — Telephone Encounter (Signed)
Please see message. °

## 2020-08-13 ENCOUNTER — Ambulatory Visit (INDEPENDENT_AMBULATORY_CARE_PROVIDER_SITE_OTHER): Payer: 59 | Admitting: Orthopedic Surgery

## 2020-08-13 DIAGNOSIS — M170 Bilateral primary osteoarthritis of knee: Secondary | ICD-10-CM

## 2020-08-13 DIAGNOSIS — M79672 Pain in left foot: Secondary | ICD-10-CM

## 2020-08-13 DIAGNOSIS — M79671 Pain in right foot: Secondary | ICD-10-CM

## 2020-08-13 DIAGNOSIS — G894 Chronic pain syndrome: Secondary | ICD-10-CM | POA: Diagnosis not present

## 2020-08-20 ENCOUNTER — Encounter: Payer: Self-pay | Admitting: Orthopedic Surgery

## 2020-08-20 NOTE — Progress Notes (Signed)
Office Visit Note   Patient: Kimberly Wells           Date of Birth: [birth year]           MRN: [id number] Visit Date: 08/13/2020              Requested by: Maryagnes Amos, FNP [address] [place],  [place] [address] PCP: Maryagnes Amos, FNP  Chief Complaint  Patient presents with   Right Foot - Pain      HPI: Patient is a 45 year old woman who presents complaining of global joint pain.  She states she is concerned this may be a rheumatologic condition.  She also has global pain in both feet and both knees.  Assessment & Plan: Visit Diagnoses:  1. Bilateral foot pain   2. Bilateral primary osteoarthritis of knee   3. Chronic pain syndrome     Plan: We will obtain a rheumatologic screen discussed if these values are elevated we would refer to a rheumatologist.  Patient is still awaiting authorization for hyaluronic acid injections for her knees.  Recommended Voltaren gel for the foot and ankle pain recommended a stiff soled sneaker, discussed that she may need subtalar and talonavicular fusion.  Follow-Up Instructions: Return in about 4 weeks (around 09/10/2020).   Ortho Exam  Patient is alert, oriented, no adenopathy, well-dressed, normal affect, normal respiratory effort. Examination of both feet she has a pronated valgus flatfoot with posterior tibial tendon insufficiency the posterior tibial tendon is tender to palpation the plantar fascia is tender to palpation she cannot do a single limb heel raise.  Patient has crepitation pain with range of motion of both knees.  Imaging: No results found. No images are attached to the encounter.  Labs: Lab Results  Component Value Date   HGBA1C 5.5 08/31/2019   REPTSTATUS 08/10/2013 FINAL 08/08/2013   CULT NO GROWTH Performed at [facility] 08/08/2013     Lab Results  Component Value Date   ALBUMIN 3.4 (L) 10/06/2019   ALBUMIN 3.9 08/31/2019   ALBUMIN 3.4 (L) 09/30/2011    No  results found for: MG Lab Results  Component Value Date   VD25OH 16.8 (L) 08/31/2019    No results found for: PREALBUMIN CBC EXTENDED Latest Ref Rng & Units 10/06/2019 10/02/2019 08/31/2019  WBC 4.0 - 10.5 K/uL 11.9(H) 8.8 8.6  RBC 3.87 - 5.11 MIL/uL 4.52 5.03 4.67  HGB 12.0 - 15.0 g/dL 41.6 38.4 53.6  HCT 46.8 - 46.0 % 38.0 42.2 38.5  PLT 150 - 400 K/uL 382 406(H) 364  NEUTROABS 1.7 - 7.7 K/uL 9.6(H) - 6.0  LYMPHSABS 0.7 - 4.0 K/uL 1.4 - 1.8     There is no height or weight on file to calculate BMI.  Orders:  No orders of the defined types were placed in this encounter.  No orders of the defined types were placed in this encounter.    Procedures: No procedures performed  Clinical Data: No additional findings.  ROS:  All other systems negative, except as noted in the HPI. Review of Systems  Objective: Vital Signs: There were no vitals taken for this visit.  Specialty Comments:  No specialty comments available.  PMFS History: Patient Active Problem List   Diagnosis Date Noted   Depression 12/19/2012   Past Medical History:  Diagnosis Date   ADHD    Anemia    Anxiety    Asthma    B12 deficiency    Depression  Depression    Eczema    GERD (gastroesophageal reflux disease)    Joint pain    Kidney infection    Lactose intolerance    Lymphedema    Multiple food allergies    Sleep apnea    Vitamin D deficiency     Family History  Problem Relation Age of Onset   Depression Mother    Anxiety disorder Mother    Hypertension Mother    Hyperlipidemia Mother    Heart disease Mother    Obesity Mother    Depression Father     Past Surgical History:  Procedure Laterality Date   TONSILLECTOMY     Social History   Occupational History   Occupation: Buyer, retail  Tobacco Use   Smoking status: Never   Smokeless tobacco: Never  Substance and Sexual Activity   Alcohol use: No   Drug use: No   Sexual activity: Never    Birth  control/protection: I.U.D.

## 2020-08-20 NOTE — Telephone Encounter (Signed)
Tried calling pt to get updated insurance information to get auth for gel injections. Was unable to leave voice mail because mail box was full

## 2020-09-10 ENCOUNTER — Ambulatory Visit: Payer: 59 | Admitting: Orthopedic Surgery

## 2020-09-10 NOTE — Telephone Encounter (Signed)
Submitted for VOB for synvisc one-bilateral knee 

## 2020-09-11 NOTE — Telephone Encounter (Signed)
PA completed and faxed. Waiting on response

## 2020-09-13 NOTE — Telephone Encounter (Signed)
Synvisc one is no longer covered by [employer]...submitted for VOB for Monovisc..pa resubmitted for monovisc

## 2020-09-25 NOTE — Telephone Encounter (Signed)
Received Denial letter for the pt. Pt needs followup appt to doccument 3 months of conservative treatment and possible cortisone. We can resubmit once this has been completed.

## 2020-09-25 NOTE — Telephone Encounter (Signed)
Pt is coming in to see you guys tomorrow. Can you please have whoever is seeing her do documentation on her knee please?

## 2020-09-25 NOTE — Telephone Encounter (Signed)
Documented on snap shot for appt tomorrow.

## 2020-09-26 ENCOUNTER — Ambulatory Visit: Payer: 59 | Admitting: Orthopedic Surgery

## 2021-04-12 ENCOUNTER — Other Ambulatory Visit: Payer: Self-pay

## 2021-04-12 DIAGNOSIS — I83893 Varicose veins of bilateral lower extremities with other complications: Secondary | ICD-10-CM

## 2021-04-18 ENCOUNTER — Ambulatory Visit (HOSPITAL_COMMUNITY)
Admission: RE | Admit: 2021-04-18 | Discharge: 2021-04-18 | Disposition: A | Discharge status: Home / Self Care | Payer: 59 | Source: Ambulatory Visit | Attending: Vascular Surgery | Admitting: Vascular Surgery

## 2021-04-18 ENCOUNTER — Encounter: Payer: Self-pay | Admitting: Vascular Surgery

## 2021-04-18 ENCOUNTER — Ambulatory Visit (INDEPENDENT_AMBULATORY_CARE_PROVIDER_SITE_OTHER): Payer: 59 | Admitting: Vascular Surgery

## 2021-04-18 ENCOUNTER — Other Ambulatory Visit: Payer: Self-pay

## 2021-04-18 VITALS — BP 139/92 | HR 81 | Temp 98.2°F | Resp 20 | Ht 61.0 in | Wt 244.0 lb

## 2021-04-18 DIAGNOSIS — I83893 Varicose veins of bilateral lower extremities with other complications: Secondary | ICD-10-CM | POA: Diagnosis present

## 2021-04-18 DIAGNOSIS — R609 Edema, unspecified: Secondary | ICD-10-CM | POA: Diagnosis not present

## 2021-04-18 DIAGNOSIS — I890 Lymphedema, not elsewhere classified: Secondary | ICD-10-CM | POA: Diagnosis not present

## 2021-04-18 NOTE — Progress Notes (Signed)
?Office Note  ? ? ? ?CC: Bilateral lower extremity heaviness ?Requesting Provider:  Maryagnes Amos,* ? ?HPI: MATILYNN MEADOWCROFT is a 46 y.o. (04-05-1975) female who presents at the request of Maryagnes Amos, FNP for evaluation of bilateral lower extremity heaviness.  Nayelli was first diagnosed with mixed lymphedema / lipedema -stage II in [place] at the [facility] in 2019.  At the time, she was dating a health-conscious gentleman who helped her lose weight, change her diet, live an active, healthier lifestyle.  During the pandemic, their relationship ended leading to a 45 pound weight gain.  [name], a single mother and full-time employee, found it difficult to make her health a priority.  During a break-up and over the course of her recent weight gain, she is also struggled with baseline depression.  He would like to find a significant other but is found putting her stepfather difficult due to her schedule. ? ?She presents today for lower extremity follow-up due to bilateral lower extremity pain.  The pain is worse by the end of the day, and is accompanied by heaviness and tired feeling.  She denies significant varicosities, color changes at the level of the ankle.  She wears compression stockings inconsistently as well as has lymphedema pumps that she uses inconsistently.  In 2019, she was compliant with both, which helped significantly. ? ?She does not have a history of DVT or previous vein procedures ? ?The pt noton a statin for cholesterol management.  ?The pt not on a daily aspirin.   Other AC:  - ?The pt is not on medications for hypertension.   ?The pt is not diabetic.   ?Tobacco hx:  - ? ?Past Medical History:  ?Diagnosis Date  ? ADHD   ? Anemia   ? Anxiety   ? Asthma   ? B12 deficiency   ? Depression   ? Depression   ? Eczema   ? GERD (gastroesophageal reflux disease)   ? Joint pain   ? Kidney infection   ? Lactose intolerance   ? Lymphedema   ? Multiple food allergies   ? Sleep  apnea   ? Vitamin D deficiency   ? ? ?Past Surgical History:  ?Procedure Laterality Date  ? TONSILLECTOMY    ? ? ?Social History  ? ?Socioeconomic History  ? Marital status: Single  ?  Spouse name: Not on file  ? Number of children: Not on file  ? Years of education: Not on file  ? Highest education level: Not on file  ?Occupational History  ? Occupation: Buyer, retail  ?Tobacco Use  ? Smoking status: Never  ? Smokeless tobacco: Never  ?Substance and Sexual Activity  ? Alcohol use: No  ? Drug use: No  ? Sexual activity: Never  ?  Birth control/protection: I.U.D.  ?Other Topics Concern  ? Not on file  ?Social History Narrative  ? Not on file  ? ?Social Determinants of Health  ? ?Financial Resource Strain: Not on file  ?Food Insecurity: Not on file  ?Transportation Needs: Not on file  ?Physical Activity: Not on file  ?Stress: Not on file  ?Social Connections: Not on file  ?Intimate Partner Violence: Not on file  ? ?Family History  ?Problem Relation Age of Onset  ? Depression Mother   ? Anxiety disorder Mother   ? Hypertension Mother   ? Hyperlipidemia Mother   ? Heart disease Mother   ? Obesity Mother   ? Depression Father   ? ? ?  Current Outpatient Medications  ?Medication Sig Dispense Refill  ? budesonide-formoterol (SYMBICORT) 160-4.5 MCG/ACT inhaler Inhale 2 puffs into the lungs 2 (two) times daily. 1 each 5  ? dexmethylphenidate (FOCALIN XR) 10 MG 24 hr capsule Take 20 mg by mouth daily.    ? esomeprazole (NEXIUM) 40 MG capsule TAKE 1 CAPSULE (40 MG TOTAL) BY MOUTH DAILY AT 12 NOON. 30 capsule 0  ? ferrous sulfate 325 (65 FE) MG EC tablet Take by mouth.    ? furosemide (LASIX) 20 MG tablet Take 10 mg by mouth daily.     ? HYDROcodone-acetaminophen (NORCO/VICODIN) 5-325 MG tablet Take 1-2 tablets by mouth every 6 (six) hours as needed. 6 tablet 0  ? meloxicam (MOBIC) 7.5 MG tablet Take 7.5 mg by mouth daily.    ? metFORMIN (GLUCOPHAGE) 500 MG tablet TAKE 1 TABLET BY MOUTH EVERY DAY WITH BREAKFAST 30  tablet 0  ? omeprazole (PRILOSEC) 20 MG capsule Take 1 capsule (20 mg total) by mouth daily. 30 capsule 0  ? sucralfate (CARAFATE) 1 g tablet Take 1 tablet (1 g total) by mouth 4 (four) times daily -  with meals and at bedtime for 21 days. 84 tablet 0  ? topiramate (TOPAMAX) 50 MG tablet TAKE 1 TABLET (50 MG TOTAL) BY MOUTH DAILY WITH SUPPER. 30 tablet 0  ? ?No current facility-administered medications for this visit.  ? ? ?No Known Allergies ? ? ?REVIEW OF SYSTEMS:  ? ?[X]  denotes positive finding, [ ]  denotes negative finding ?Cardiac  Comments:  ?Chest pain or chest pressure:    ?Shortness of breath upon exertion:    ?Short of breath when lying flat:    ?Irregular heart rhythm:    ?    ?Vascular    ?Pain in calf, thigh, or hip brought on by ambulation:    ?Pain in feet at night that wakes you up from your sleep:     ?Blood clot in your veins:    ?Leg swelling:  X   ?    ?Pulmonary    ?Oxygen at home:    ?Productive cough:     ?Wheezing:     ?    ?Neurologic    ?Sudden weakness in arms or legs:     ?Sudden numbness in arms or legs:     ?Sudden onset of difficulty speaking or slurred speech:    ?Temporary loss of vision in one eye:     ?Problems with dizziness:     ?    ?Gastrointestinal    ?Blood in stool:     ?Vomited blood:     ?    ?Genitourinary    ?Burning when urinating:     ?Blood in urine:    ?    ?Psychiatric    ?Major depression:     ?    ?Hematologic    ?Bleeding problems:    ?Problems with blood clotting too easily:    ?    ?Skin    ?Rashes or ulcers:    ?    ?Constitutional    ?Fever or chills:    ? ? ?PHYSICAL EXAMINATION: ? ?There were no vitals filed for this visit. ? ?General:  WDWN in NAD; vital signs documented above ?Gait: Not observed ?HENT: WNL, normocephalic ?Pulmonary: normal non-labored breathing , without Rales, rhonchi,  wheezing ?Cardiac: regular HR ?Abdomen: soft, NT, no masses ?Skin: without rashes ?Vascular Exam/Pulses: ? Right Left  ?Radial 2+ (normal) 2+ (normal)  ?Ulnar 2+  (normal) 2+ (normal)  ?  Femoral    ?Popliteal    ?DP 2+ (normal) 2+ (normal)  ?PT 2+ (normal) 2+ (normal)  ? ?Extremities: without ischemic changes, without Gangrene , without cellulitis; without open wounds;  ?Large legs bilaterally, with stage II lipedema, no fibrosis appreciated ?Musculoskeletal: no muscle wasting or atrophy  ?Neurologic: A&O X 3;  No focal weakness or paresthesias are detected ?Psychiatric:  The pt has Normal affect. ? ? ?Non-Invasive Vascular Imaging:   ? ?Summary:  ?Right:  ?- No evidence of deep vein thrombosis from the common femoral through the  ?popliteal veins.  ?- No evidence of superficial venous thrombosis.  ?- The deep venous system is competent.  ?- The great and small saphenous veins are competent.  ?   ?*See table(s) above for measurements and observations.  ? ? ? ?ASSESSMENT/PLAN:Lauren ESTEFANIA KAMIYA is a 46 y.o. female presenting with recent 45 pound weight gain with increased heaviness and tired feeling in bilateral lower extremities by days end.  Is a full-time employee and single mom, she has found it difficult to make time for herself, specifically and eating healthy and living an active lifestyle. ?Chronic venous insufficiency work-up demonstrated no significant reflux or DVT. ? ?I had a long conversation with [place] regarding the above.  [name] would be best served with lifestyle changes, namely weight loss from diet and exercise.  [name] is very aware of this, and is going to make strides to do so.  Regarding her depression, I think becoming more active would help.  I also encouraged her to talk to her daughter who is 38 regarding the above as they have a strong relationship.  She would also benefit from continued compression therapy, as well as use of her lymphedema compression machine. ? ?I told [name] I am available should she have any questions or concerns.   ?She can follow-up with my office as needed. ? ? ?Victorino Sparrow, MD ?[facility] ?[phone] ? ?

## 2021-06-01 ENCOUNTER — Other Ambulatory Visit: Payer: Self-pay

## 2021-06-01 ENCOUNTER — Encounter (HOSPITAL_COMMUNITY): Payer: Self-pay | Admitting: Emergency Medicine

## 2021-06-01 ENCOUNTER — Emergency Department (HOSPITAL_COMMUNITY)
Admission: EM | Admit: 2021-06-01 | Discharge: 2021-06-02 | Discharge status: Against Medical Advice | Payer: 59 | Attending: Physician Assistant | Admitting: Physician Assistant

## 2021-06-01 DIAGNOSIS — Z5321 Procedure and treatment not carried out due to patient leaving prior to being seen by health care provider: Secondary | ICD-10-CM | POA: Insufficient documentation

## 2021-06-01 DIAGNOSIS — R202 Paresthesia of skin: Secondary | ICD-10-CM | POA: Insufficient documentation

## 2021-06-01 DIAGNOSIS — R2243 Localized swelling, mass and lump, lower limb, bilateral: Secondary | ICD-10-CM | POA: Insufficient documentation

## 2021-06-01 DIAGNOSIS — R519 Headache, unspecified: Secondary | ICD-10-CM | POA: Diagnosis present
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
## 2021-06-01 NOTE — ED Triage Notes (Signed)
Pt reported to ED with c/o left leg pain. States she has a hx of lymphedema to leg. Pt reports concern for DVT. Also states she has tingling in her fingers that radiates upwards.  ?

## 2021-06-02 ENCOUNTER — Emergency Department (HOSPITAL_COMMUNITY): Payer: 59

## 2021-06-02 LAB — COMPREHENSIVE METABOLIC PANEL
ALT: 17 U/L (ref 0–44)
AST: 18 U/L (ref 15–41)
Albumin: 3.5 g/dL (ref 3.5–5.0)
Alkaline Phosphatase: 75 U/L (ref 38–126)
Anion gap: 6 (ref 5–15)
BUN: 14 mg/dL (ref 6–20)
CO2: 20 mmol/L — ABNORMAL LOW (ref 22–32)
Calcium: 9.2 mg/dL (ref 8.9–10.3)
Chloride: 111 mmol/L (ref 98–111)
Creatinine, Ser: 1.06 mg/dL — ABNORMAL HIGH (ref 0.44–1.00)
GFR, Estimated: >60 mL/min (ref >60)
Glucose, Bld: 88 mg/dL (ref 70–99)
Potassium: 4 mmol/L (ref 3.5–5.1)
Sodium: 137 mmol/L (ref 135–145)
Total Bilirubin: 0.4 mg/dL (ref 0.3–1.2)
Total Protein: 7.4 g/dL (ref 6.5–8.1)

## 2021-06-02 LAB — CBC WITH DIFFERENTIAL/PLATELET
Abs Immature Granulocytes: 0.02 10*3/uL (ref 0.00–0.07)
Basophils Absolute: 0 10*3/uL (ref 0.0–0.1)
Basophils Relative: 0 %
Eosinophils Absolute: 0.3 10*3/uL (ref 0.0–0.5)
Eosinophils Relative: 3 %
HCT: 40.7 % (ref 36.0–46.0)
Hemoglobin: 12.7 g/dL (ref 12.0–15.0)
Immature Granulocytes: 0 %
Lymphocytes Relative: 28 %
Lymphs Abs: 2.2 10*3/uL (ref 0.7–4.0)
MCH: 25.6 pg — ABNORMAL LOW (ref 26.0–34.0)
MCHC: 31.2 g/dL (ref 30.0–36.0)
MCV: 82.1 fL (ref 80.0–100.0)
Monocytes Absolute: 0.6 10*3/uL (ref 0.1–1.0)
Monocytes Relative: 8 %
Neutro Abs: 4.8 10*3/uL (ref 1.7–7.7)
Neutrophils Relative %: 61 %
Platelets: 364 10*3/uL (ref 150–400)
RBC: 4.96 MIL/uL (ref 3.87–5.11)
RDW: 13.6 % (ref 11.5–15.5)
WBC: 8 10*3/uL (ref 4.0–10.5)
nRBC: 0 % (ref 0.0–0.2)

## 2021-06-02 LAB — BRAIN NATRIURETIC PEPTIDE: B Natriuretic Peptide: 50.6 pg/mL (ref 0.0–100.0)

## 2021-06-02 NOTE — ED Notes (Signed)
Patient states she will follow up outpatient because she saw her results on mychart ?

## 2021-06-09 ENCOUNTER — Telehealth: Payer: Self-pay

## 2021-06-09 ENCOUNTER — Ambulatory Visit: Payer: 59 | Admitting: Physician Assistant

## 2021-06-09 ENCOUNTER — Encounter: Payer: Self-pay | Admitting: Physician Assistant

## 2021-06-09 ENCOUNTER — Ambulatory Visit (INDEPENDENT_AMBULATORY_CARE_PROVIDER_SITE_OTHER): Payer: 59 | Admitting: Physician Assistant

## 2021-06-09 VITALS — Ht 59.0 in | Wt 245.0 lb

## 2021-06-09 DIAGNOSIS — M170 Bilateral primary osteoarthritis of knee: Secondary | ICD-10-CM | POA: Diagnosis not present

## 2021-06-09 DIAGNOSIS — M1712 Unilateral primary osteoarthritis, left knee: Secondary | ICD-10-CM

## 2021-06-09 DIAGNOSIS — M25571 Pain in right ankle and joints of right foot: Secondary | ICD-10-CM | POA: Diagnosis not present

## 2021-06-09 DIAGNOSIS — M1711 Unilateral primary osteoarthritis, right knee: Secondary | ICD-10-CM | POA: Diagnosis not present

## 2021-06-09 MED ORDER — METHYLPREDNISOLONE ACETATE 40 MG/ML IJ SUSP
80.0000 mg | INTRAMUSCULAR | Status: AC | PRN
  Administered 2021-06-09: 80 mg via INTRA_ARTICULAR

## 2021-06-09 MED ORDER — MELOXICAM 15 MG PO TABS: 15.0000 mg | ORAL_TABLET | Freq: Every day | ORAL | 2 refills | Status: AC

## 2021-06-09 MED ORDER — LIDOCAINE HCL 1 % IJ SOLN
5.0000 mL | INTRAMUSCULAR | Status: AC | PRN
  Administered 2021-06-09: 5 mL

## 2021-06-09 NOTE — Telephone Encounter (Signed)
Do you mind submitting this please?  Please let me know.  Thank you. ?

## 2021-06-09 NOTE — Progress Notes (Signed)
? ?Office Visit Note ?  ?Patient: Kimberly Wells           ?Date of Birth: March 06, 1975           ?MRN: [id number] ?Visit Date: 06/09/2021 ?             ?Requested by: Maryagnes Amos, FNP ?[address] ?[address] ?[place],  [place] [address] ?PCP: Maryagnes Amos, FNP ? ?Chief Complaint  ?Patient presents with  ?? Right Knee - Pain  ?? Left Knee - Pain  ?? Right Ankle - Pain  ? ? ? ? ?HPI: ?Patient presents today for follow-up on her bilateral knee pain as well as right ankle pain.  She has a history of arthritis in her bilateral knees.  She has had injections of steroid in the past and they seem to help for a while.  She was supposed to go forward with gel injections but was unable to do so at the time.  She denies any injuries.  She does admit that she put on quite a bit of weight during the pandemic.  She also is asking to have her right ankle evaluated.  She has had this seen in the past and has had injections and is trying to wear good shoe wear. ? ?Assessment & Plan: ?Visit Diagnoses:  ?1. Bilateral primary osteoarthritis of knee   ?Right ankle pain ? ?Plan: We will go forward with injections into her knees today.  I told her I cannot do an injection in her ankle as well.  She is more interested in doing some physical therapy for both.  I think this would be a good idea.  I also would like for her to start on meloxicam instead of Naprosyn or ibuprofen.  We will also go forward with authorization for bilateral gel injections. ? ?Follow-Up Instructions: No follow-ups on file.  ? ?Ortho Exam ? ?Patient is alert, oriented, no adenopathy, well-dressed, normal affect, normal respiratory effort. ?Examination of her bilateral knees no effusion mild soft tissue swelling no warmth.  Compartments are soft and nontender.  She has global tenderness right greater than left over the medial lateral and patellofemoral compartments. ?Right ankle she does have some pes planus.  She has good strength with  dorsiflexion plantarflexion inversion eversion ? ?Imaging: ?No results found. ?No images are attached to the encounter. ? ?Labs: ?Lab Results  ?Component Value Date  ? HGBA1C 5.5 08/31/2019  ? REPTSTATUS 08/10/2013 FINAL 08/08/2013  ? CULT NO GROWTH ?Performed at [facility] 08/08/2013  ? ? ? ?Lab Results  ?Component Value Date  ? ALBUMIN 3.5 06/01/2021  ? ALBUMIN 3.4 (L) 10/06/2019  ? ALBUMIN 3.9 08/31/2019  ? ? ?No results found for: MG ?Lab Results  ?Component Value Date  ? VD25OH 16.8 (L) 08/31/2019  ? ? ?No results found for: PREALBUMIN ? ?  Latest Ref Rng & Units 06/01/2021  ? 11:48 PM 10/06/2019  ?  5:53 PM 10/02/2019  ? 10:48 PM  ?CBC EXTENDED  ?WBC 4.0 - 10.5 K/uL 8.0   11.9   8.8    ?RBC 3.87 - 5.11 MIL/uL 4.96   4.52   5.03    ?Hemoglobin 12.0 - 15.0 g/dL 19.3   79.0   24.0    ?HCT 36.0 - 46.0 % 40.7   38.0   42.2    ?Platelets 150 - 400 K/uL 364   382   406    ?NEUT# 1.7 - 7.7 K/uL 4.8   9.6     ?  Lymph# 0.7 - 4.0 K/uL 2.2   1.4     ? ? ? ?Body mass index is 49.48 kg/m?. ? ?Orders:  ?Orders Placed This Encounter  ?Procedures  ?? Ambulatory referral to Physical Therapy  ? ?Meds ordered this encounter  ?Medications  ?? meloxicam (MOBIC) 15 MG tablet  ?  Sig: Take 1 tablet (15 mg total) by mouth daily.  ?  Dispense:  30 tablet  ?  Refill:  2  ? ? ? Procedures: ?Large Joint Inj: bilateral knee on 06/09/2021 2:03 PM ?Indications: pain and diagnostic evaluation ?Details: 25 G 1.5 in needle, anteromedial approach ? ?Arthrogram: No ? ?Medications (Right): 5 mL lidocaine 1 %; 80 mg methylPREDNISolone acetate 40 MG/ML ?Medications (Left): 5 mL lidocaine 1 %; 80 mg methylPREDNISolone acetate 40 MG/ML ?Outcome: tolerated well, no immediate complications ?Procedure, treatment alternatives, risks and benefits explained, specific risks discussed. Consent was given by the patient.  ? ? ?Clinical Data: ?No additional findings. ? ?ROS: ? ?All other systems negative, except as noted in the HPI. ?Review of  Systems ? ?Objective: ?Vital Signs: Ht 4\' 11"  (1.499 m)   Wt 245 lb (111.1 kg)   BMI 49.48 kg/m?  ? ?Specialty Comments:  ?No specialty comments available. ? ?PMFS History: ?Patient Active Problem List  ? Diagnosis Date Noted  ?? Depression 12/19/2012  ? ?Past Medical History:  ?Diagnosis Date  ?? ADHD   ?? Anemia   ?? Anxiety   ?? Asthma   ?? B12 deficiency   ?? Depression   ?? Depression   ?? Eczema   ?? GERD (gastroesophageal reflux disease)   ?? Joint pain   ?? Kidney infection   ?? Lactose intolerance   ?? Lymphedema   ?? Multiple food allergies   ?? Sleep apnea   ?? Vitamin D deficiency   ?  ?Family History  ?Problem Relation Age of Onset  ?? Depression Mother   ?? Anxiety disorder Mother   ?? Hypertension Mother   ?? Hyperlipidemia Mother   ?? Heart disease Mother   ?? Obesity Mother   ?? Depression Father   ?  ?Past Surgical History:  ?Procedure Laterality Date  ?? TONSILLECTOMY    ? ?Social History  ? ?Occupational History  ?? Occupation: 13/04/2012  ?Tobacco Use  ?? Smoking status: Never  ?? Smokeless tobacco: Never  ?Substance and Sexual Activity  ?? Alcohol use: No  ?? Drug use: No  ?? Sexual activity: Never  ?  Birth control/protection: I.U.D.  ? ? ? ? ? ?

## 2021-06-09 NOTE — Telephone Encounter (Signed)
Please precert for bilateal visco injections. This is a patient of Mary Anne's. ?Thanks! ?

## 2021-06-09 NOTE — Telephone Encounter (Signed)
Submitted for Synvisc One-Bilateral knee  ?

## 2021-06-12 NOTE — Telephone Encounter (Signed)
Submitted for VOB for Durolane ?

## 2021-06-12 NOTE — Telephone Encounter (Signed)
Received benefits. Her insurance prefers Durolane now. I will resubmit for this ?

## 2021-06-13 NOTE — Telephone Encounter (Signed)
Approved for Durolane-Bilateral knee ?Buy and Bill ?$40 copay ?Plan covered 90% of allowable amount for medication ?No precert required  ?

## 2021-06-13 NOTE — Telephone Encounter (Signed)
Tried calling pt to schedule but no answer and mailbox was full ?

## 2021-06-18 NOTE — Therapy (Signed)
?OUTPATIENT PHYSICAL THERAPY EVALUATION ? ? ?Patient Name: Kimberly Wells ?MRN: [id number] ?DOB:Sep 13, 1975, 46 y.o., female ?Today's Date: 06/20/2021 ? ? PT End of Session - 06/20/21 1125   ? ? Visit Number 1   ? Number of Visits 20   ? Date for PT Re-Evaluation 08/29/21   ? Authorization Type [employer] 10% coinsurance   ? PT Start Time 1118   ? PT Stop Time 1146   ? PT Time Calculation (min) 28 min   ? Activity Tolerance Patient tolerated treatment well   ? Behavior During Therapy [facility] for tasks assessed/performed   ? ?  ?  ? ?  ? ? ?Past Medical History:  ?Diagnosis Date  ? ADHD   ? Anemia   ? Anxiety   ? Asthma   ? B12 deficiency   ? Depression   ? Depression   ? Eczema   ? GERD (gastroesophageal reflux disease)   ? Joint pain   ? Kidney infection   ? Lactose intolerance   ? Lymphedema   ? Multiple food allergies   ? Sleep apnea   ? Vitamin D deficiency   ? ?Past Surgical History:  ?Procedure Laterality Date  ? TONSILLECTOMY    ? ?Patient Active Problem List  ? Diagnosis Date Noted  ? Depression 12/19/2012  ? ? ?PCP: Jonah Blue, Juel Burrow FNP ? ?REFERRING PROVIDER: Persons, [place], [place] ? ?REFERRING DIAG: M17.0 (ICD-10-CM) - Bilateral primary osteoarthritis of knee ? ?THERAPY DIAG:  ?Chronic pain of left knee ? ?Chronic pain of right knee ? ?Pain in right ankle and joints of right foot ? ?Muscle weakness (generalized) ? ?Difficulty in walking, not elsewhere classified ? ?ONSET DATE:  5 years.  ? ?SUBJECTIVE:  ? ?SUBJECTIVE STATEMENT: ?Pt indicated complaints of bilateral knee pain and also Rt ankle pain. Pt indicated improvement from injections recently (on 06/09/2021).   ? ?PERTINENT HISTORY: ?Anxiety, anemia, depression, GERD, lymphedema hx (bilateral leg since pregnancy) ? ?PAIN:  ?Are you having pain? Yes: NPRS scale: moderate to severe (no number given) ?Pain location: bilateral knee, Rt ankle ?Pain description: achy, sharp ?Aggravating factors: bending, squatting, prolonged standing/walking, stairs ?Relieving  factors: injections in knees, massage ? ?PRECAUTIONS: None ? ?WEIGHT BEARING RESTRICTIONS No ? ?FALLS:  ?Has patient fallen in last 6 months? No ? ?LIVING ENVIRONMENT: ?Lives with: lives with their family ?Lives in: House/apartment ?Stairs: 3 stairs to enter house from garage with no rails., deck stairs. ? ?OCCUPATION: Work from - sedentary ? ?PLOF: Independent, reading ? ?PATIENT GOALS Reduce pain, do some exercises for knees and ankles to get back to gym activity ? ? ?OBJECTIVE:  ? ?PATIENT SURVEYS:  ?06/20/2021: FOTO not performed (take on 2nd visit) ? ?COGNITION: ?06/20/2021 Overall cognitive status: Within functional limits for tasks assessed   ?  ?POSTURE:  ?06/20/2021 : increased body weight based off BMI ? ?PALPATION: ?06/20/2021 : check in future visits (limited by time) ? ?LE ROM: ? ?Active ROM Right ?06/20/2021 Left ?06/20/2021  ?Hip flexion    ?Hip extension    ?Hip abduction    ?Hip adduction    ?Hip internal rotation    ?Hip external rotation    ?Knee flexion 114 113  ?Knee extension 0 0  ?Ankle dorsiflexion    ?Ankle plantarflexion    ?Ankle inversion    ?Ankle eversion    ? (Blank rows = not tested) ? ?LE MMT: ? ?MMT Right ?06/20/2021 Left ?06/20/2021  ?Hip flexion 5/5 5/5  ?Hip extension    ?  Hip abduction    ?Hip adduction    ?Hip internal rotation    ?Hip external rotation    ?Knee flexion    ?Knee extension 5/5 ?29.5, 30.5 lbs 5/5 ?33, 31.5 lbs  ?Ankle dorsiflexion 3/5 (5 reps single leg against gravity) 4/5 (10 reps single leg against gravity)  ?Ankle plantarflexion 5/5 5/5  ?Ankle inversion 4/5 4/5  ?Ankle eversion 4/5 5/5  ? (Blank rows = not tested) ? ?FUNCTIONAL TESTS:  ?06/20/2021:    Lt SLS:  7 seconds Rt SLS: < 3 seconds ?  18 inch chair transfer:  s UE on 1st try no complaints ? ?GAIT: ?06/20/2021  :  independent ambulation ? ? ? ?TODAY'S TREATMENT: ?06/20/2021 ?Therex: ?  HEP instruction/performance c cues for techniques, handout provided.  Trial set performed of each for comprehension and symptom assessment.   See below for exercise list. ? ? ?PATIENT EDUCATION:  ?06/20/2021 ?Education details: HEP, POC ?Person educated: Patient ?Education method: Explanation, Demonstration, Tactile cues, Verbal cues, and Handouts ?Education comprehension: verbalized understanding, returned demonstration, verbal cues required, and tactile cues required ? ? ?HOME EXERCISE PROGRAM: ?06/20/2021 ?Access Code: [email or web address] ?URL: [email or web address] ?Date: 06/20/2021 ?Prepared by: Chyrel MassonMichael Ambreen Tufte ? ?Exercises ?- Seated Long Arc Quad  - 2 x daily - 7 x weekly - 3 sets - 10 reps - 2 hold ?- Church Pew  - 3-5 x daily - 7 x weekly - 1 sets - 1-2 mins hold ?- Retro Step  - 3-5 x daily - 7 x weekly - 1 sets - 10-20 reps ?- Seated Quad Set  - 3-5 x daily - 7 x weekly - 1 sets - 10 reps - 5 hold ?- Seated Straight Leg Heel Taps  - 1-2 x daily - 7 x weekly - 3 sets - 10 reps ? ?ASSESSMENT: ? ?CLINICAL IMPRESSION: ?Patient is a 46 y.o. who comes to clinic with complaints of bilateral knee pain, Rt ankle pain with mobility, strength and movement coordination deficits that impair their ability to perform usual daily and recreational functional activities without increase difficulty/symptoms at this time.  Patient to benefit from skilled PT services to address impairments and limitations to improve to previous level of function without restriction secondary to condition.  ? ? ?OBJECTIVE IMPAIRMENTS decreased activity tolerance, decreased balance, decreased coordination, decreased endurance, decreased mobility, difficulty walking, decreased strength, increased edema, impaired perceived functional ability, impaired flexibility, improper body mechanics, and pain.  ? ?ACTIVITY LIMITATIONS cleaning, community activity, laundry, and exercise routine .  ? ?PERSONAL FACTORS  Anxiety, anemia, depression, GERD, lymphedema hx  are also affecting patient's functional outcome.  ? ? ?REHAB POTENTIAL: Good ? ?CLINICAL DECISION MAKING:  Stable/uncomplicated ? ?EVALUATION COMPLEXITY: Low ? ? ?GOALS: ?Goals reviewed with patient? Yes ? ?Short term PT Goals (target date for Short term goals are 3 weeks 07/11/2021) ?Patient will demonstrate independent use of home exercise program to maintain progress from in clinic treatments. ?Goal status: New ?  ?Long term PT goals (target dates for all long term goals are 10 weeks  08/29/2021 ) ? ?1. Patient will demonstrate/report pain at worst less than or equal to 2/10 to facilitate minimal limitation in daily activity secondary to pain symptoms. ?Goal status: New ? ?2. Patient will demonstrate independent use of home exercise program to facilitate ability to maintain/progress functional gains from skilled physical therapy services. ?Goal status: New ? ?3. Patient will demonstrate FOTO outcome > or = ____ % to indicate reduced disability due to condition.  (Take on 2nd visit  and fill in) ?Goal status: New ? ?4.  Patient will demonstrate bilateral LE MMT 5/5, knee extension dynamometry increase >= 15 lbs to facilitate ability to perform usual standing, walking, stairs at PLOF s limitation due to symptoms. ?Goal status: New ? ?5.  Patient will demonstrate SLS > 15 seconds to facilitate stability in ambulation on even and uneven surfaces.   ? Goal status: New ? ?6.  Patient will demonstrate ascending/descending stairs c reciprocal gait pattern for household navigation.  ? Goal status: New ? ? ?PLAN: ?PT FREQUENCY: 1-2x/week ? ?PT DURATION: 10 weeks ? ?PLANNED INTERVENTIONS: Therapeutic exercises, Therapeutic activity, Neuro Muscular re-education, Balance training, Gait training, Patient/Family education, Joint mobilization, Stair training, DME instructions, Dry Needling, Electrical stimulation, Cryotherapy, Moist heat, Taping, Ultrasound, Ionotophoresis 4mg /ml Dexamethasone, and Manual therapy.  All included unless contraindicated ? ?PLAN FOR NEXT SESSION:  TAKE FOTO (didn't complete visit 1) ?Review HEP knowledge.   Check ankle AROM.  Progress strengthening throughout LE c focus on avoiding WB pain generation.  ? ? ? , PT, DPT, OCS, ATC ?06/20/21  12:11 PM ? ? ? ?

## 2021-06-20 ENCOUNTER — Ambulatory Visit (INDEPENDENT_AMBULATORY_CARE_PROVIDER_SITE_OTHER): Payer: 59 | Admitting: Rehabilitative and Restorative Service Providers"

## 2021-06-20 ENCOUNTER — Other Ambulatory Visit: Payer: Self-pay

## 2021-06-20 ENCOUNTER — Ambulatory Visit: Payer: 59 | Admitting: Physician Assistant

## 2021-06-20 ENCOUNTER — Encounter: Payer: Self-pay | Admitting: Rehabilitative and Restorative Service Providers"

## 2021-06-20 ENCOUNTER — Encounter: Payer: Self-pay | Admitting: Physician Assistant

## 2021-06-20 DIAGNOSIS — M1712 Unilateral primary osteoarthritis, left knee: Secondary | ICD-10-CM | POA: Diagnosis not present

## 2021-06-20 DIAGNOSIS — M6281 Muscle weakness (generalized): Secondary | ICD-10-CM | POA: Diagnosis not present

## 2021-06-20 DIAGNOSIS — M25561 Pain in right knee: Secondary | ICD-10-CM | POA: Diagnosis not present

## 2021-06-20 DIAGNOSIS — M25571 Pain in right ankle and joints of right foot: Secondary | ICD-10-CM

## 2021-06-20 DIAGNOSIS — M1711 Unilateral primary osteoarthritis, right knee: Secondary | ICD-10-CM

## 2021-06-20 DIAGNOSIS — M25562 Pain in left knee: Secondary | ICD-10-CM

## 2021-06-20 DIAGNOSIS — G8929 Other chronic pain: Secondary | ICD-10-CM

## 2021-06-20 DIAGNOSIS — M170 Bilateral primary osteoarthritis of knee: Secondary | ICD-10-CM

## 2021-06-20 DIAGNOSIS — R262 Difficulty in walking, not elsewhere classified: Secondary | ICD-10-CM

## 2021-06-20 MED ORDER — SODIUM HYALURONATE 60 MG/3ML IX PRSY
60.0000 mg | PREFILLED_SYRINGE | INTRA_ARTICULAR | Status: AC | PRN
  Administered 2021-06-20: 60 mg via INTRA_ARTICULAR

## 2021-06-20 NOTE — Progress Notes (Signed)
? ?Office Visit Note ?  ?Patient: Kimberly Wells           ?Date of Birth: 1975/07/05           ?MRN: [id number] ?Visit Date: 06/20/2021 ?             ?Requested by: Maryagnes Amos, FNP ?[address] ?[address] ?[place],  [place] [address] ?PCP: Maryagnes Amos, FNP ? ?Chief Complaint  ?Patient presents with  ?? Left Knee - Follow-up  ?? Right Knee - Follow-up  ? ? ? ? ?HPI: ?Patient is a pleasant 46 year old woman with a history of bilateral knee arthritis.  She got some relief but not complete relief from steroid injections.  She is here for her first in series of Durolane injections ? ?Assessment & Plan: ?Visit Diagnoses:  ?1. Bilateral primary osteoarthritis of knee   ? ? ?Plan: Patient tolerated the injections well she will follow-up in 1 week ? ?Follow-Up Instructions: No follow-ups on file.  ? ?Ortho Exam ? ?Patient is alert, oriented, no adenopathy, well-dressed, normal affect, normal respiratory effort. ?Bilateral knees no effusion no redness no skin breakdown ? ?Imaging: ?No results found. ?No images are attached to the encounter. ? ?Labs: ?Lab Results  ?Component Value Date  ? HGBA1C 5.5 08/31/2019  ? REPTSTATUS 08/10/2013 FINAL 08/08/2013  ? CULT NO GROWTH ?Performed at [facility] 08/08/2013  ? ? ? ?Lab Results  ?Component Value Date  ? ALBUMIN 3.5 06/01/2021  ? ALBUMIN 3.4 (L) 10/06/2019  ? ALBUMIN 3.9 08/31/2019  ? ? ?No results found for: MG ?Lab Results  ?Component Value Date  ? VD25OH 16.8 (L) 08/31/2019  ? ? ?No results found for: PREALBUMIN ? ?  Latest Ref Rng & Units 06/01/2021  ? 11:48 PM 10/06/2019  ?  5:53 PM 10/02/2019  ? 10:48 PM  ?CBC EXTENDED  ?WBC 4.0 - 10.5 K/uL 8.0   11.9   8.8    ?RBC 3.87 - 5.11 MIL/uL 4.96   4.52   5.03    ?Hemoglobin 12.0 - 15.0 g/dL 53.9   76.7   34.1    ?HCT 36.0 - 46.0 % 40.7   38.0   42.2    ?Platelets 150 - 400 K/uL 364   382   406    ?NEUT# 1.7 - 7.7 K/uL 4.8   9.6     ?Lymph# 0.7 - 4.0 K/uL 2.2   1.4     ? ? ? ?There is no height or  weight on file to calculate BMI. ? ?Orders:  ?No orders of the defined types were placed in this encounter. ? ?No orders of the defined types were placed in this encounter. ? ? ? Procedures: ?Large Joint Inj: bilateral knee on 06/20/2021 2:21 PM ?Indications: pain and diagnostic evaluation ?Details: 22 G 1.5 in needle, anteromedial approach ? ?Arthrogram: No ? ?Medications (Right): 60 mg Sodium Hyaluronate 60 MG/3ML ?Medications (Left): 60 mg Sodium Hyaluronate 60 MG/3ML ?Outcome: tolerated well, no immediate complications ?Procedure, treatment alternatives, risks and benefits explained, specific risks discussed. Consent was given by the patient.  ? ? ?Clinical Data: ?No additional findings. ? ?ROS: ? ?All other systems negative, except as noted in the HPI. ?Review of Systems ? ?Objective: ?Vital Signs: There were no vitals taken for this visit. ? ?Specialty Comments:  ?No specialty comments available. ? ?PMFS History: ?Patient Active Problem List  ? Diagnosis Date Noted  ?? Depression 12/19/2012  ? ?Past Medical History:  ?Diagnosis Date  ??  ADHD   ?? Anemia   ?? Anxiety   ?? Asthma   ?? B12 deficiency   ?? Depression   ?? Depression   ?? Eczema   ?? GERD (gastroesophageal reflux disease)   ?? Joint pain   ?? Kidney infection   ?? Lactose intolerance   ?? Lymphedema   ?? Multiple food allergies   ?? Sleep apnea   ?? Vitamin D deficiency   ?  ?Family History  ?Problem Relation Age of Onset  ?? Depression Mother   ?? Anxiety disorder Mother   ?? Hypertension Mother   ?? Hyperlipidemia Mother   ?? Heart disease Mother   ?? Obesity Mother   ?? Depression Father   ?  ?Past Surgical History:  ?Procedure Laterality Date  ?? TONSILLECTOMY    ? ?Social History  ? ?Occupational History  ?? Occupation: Buyer, retail  ?Tobacco Use  ?? Smoking status: Never  ?? Smokeless tobacco: Never  ?Substance and Sexual Activity  ?? Alcohol use: No  ?? Drug use: No  ?? Sexual activity: Never  ?  Birth control/protection: I.U.D.   ? ? ? ? ? ?

## 2021-06-27 ENCOUNTER — Encounter: Payer: 59 | Admitting: Physical Therapy

## 2021-07-07 ENCOUNTER — Encounter: Payer: 59 | Admitting: Rehabilitative and Restorative Service Providers"

## 2021-07-07 ENCOUNTER — Telehealth: Payer: Self-pay | Admitting: Rehabilitative and Restorative Service Providers"

## 2021-07-07 NOTE — Telephone Encounter (Signed)
Pt indicated she couldn't make the scheduled time and had just finished adjusted her schedule with the front desk to Fridays (as noted in chart).  Scot Jun, PT, DPT, OCS, ATC 07/07/21  9:12 AM

## 2021-07-07 NOTE — Therapy (Incomplete)
OUTPATIENT PHYSICAL THERAPY TREATMENT NOTE   Patient Name: Kimberly Wells MRN: [id number] DOB:1975/12/15, 46 y.o., female Today's Date: 07/07/2021  END OF SESSION:    Past Medical History:  Diagnosis Date   ADHD    Anemia    Anxiety    Asthma    B12 deficiency    Depression    Depression    Eczema    GERD (gastroesophageal reflux disease)    Joint pain    Kidney infection    Lactose intolerance    Lymphedema    Multiple food allergies    Sleep apnea    Vitamin D deficiency    Past Surgical History:  Procedure Laterality Date   TONSILLECTOMY     Patient Active Problem List   Diagnosis Date Noted   Depression 12/19/2012    PCP: Jonah Blue, Juel Burrow. FNP   REFERRING PROVIDER: Persons, [place], [place]   REFERRING DIAG: M17.0 (ICD-10-CM) - Bilateral primary osteoarthritis of knee  ONSET DATE:  5 years.   THERAPY DIAG:  No diagnosis found.  Rationale for Evaluation and Treatment Rehabilitation  PERTINENT HISTORY: Anxiety, anemia, depression, GERD, lymphedema hx (bilateral leg since pregnancy)  PRECAUTIONS: None  SUBJECTIVE: ***  PAIN:  NPRS scale: moderate to severe (no number given) Pain location: bilateral knee, Rt ankle Pain description: achy, sharp Aggravating factors: bending, squatting, prolonged standing/walking, stairs Relieving factors: injections in knees, massage   OBJECTIVE:    PATIENT SURVEYS:  06/20/2021: FOTO not performed (take on 2nd visit)   COGNITION: 06/20/2021 Overall cognitive status: Within functional limits for tasks assessed                                 POSTURE:  06/20/2021 : increased body weight based off BMI   PALPATION: 06/20/2021 : check in future visits (limited by time)   LE ROM:   Active ROM Right 06/20/2021 Left 06/20/2021  Hip flexion      Hip extension      Hip abduction      Hip adduction      Hip internal rotation      Hip external rotation      Knee flexion 114 113  Knee extension 0 0  Ankle  dorsiflexion      Ankle plantarflexion      Ankle inversion      Ankle eversion       (Blank rows = not tested)   LE MMT:   MMT Right 06/20/2021 Left 06/20/2021  Hip flexion 5/5 5/5  Hip extension      Hip abduction      Hip adduction      Hip internal rotation      Hip external rotation      Knee flexion      Knee extension 5/5 29.5, 30.5 lbs 5/5 33, 31.5 lbs  Ankle dorsiflexion 3/5 (5 reps single leg against gravity) 4/5 (10 reps single leg against gravity)  Ankle plantarflexion 5/5 5/5  Ankle inversion 4/5 4/5  Ankle eversion 4/5 5/5   (Blank rows = not tested)   FUNCTIONAL TESTS:  06/20/2021:         Lt SLS:             7 seconds       Rt SLS: < 3 seconds  18 inch chair transfer:  s UE on 1st try no complaints   GAIT: 06/20/2021  :  independent ambulation       TODAY'S TREATMENT: 06/20/2021 Therex:            HEP instruction/performance c cues for techniques, handout provided.  Trial set performed of each for comprehension and symptom assessment.  See below for exercise list.     PATIENT EDUCATION:  06/20/2021 Education details: HEP, POC Person educated: Patient Education method: Explanation, Demonstration, Tactile cues, Verbal cues, and Handouts Education comprehension: verbalized understanding, returned demonstration, verbal cues required, and tactile cues required     HOME EXERCISE PROGRAM: 06/20/2021 Access Code: [email or web address] URL: [email or web address] Date: 06/20/2021 Prepared by: Chyrel MassonMichael Amol Domanski   Exercises - Seated Long Arc Quad  - 2 x daily - 7 x weekly - 3 sets - 10 reps - 2 hold - [place]  - 3-5 x daily - 7 x weekly - 1 sets - 1-2 mins hold - Retro Step  - 3-5 x daily - 7 x weekly - 1 sets - 10-20 reps - Seated Quad Set  - 3-5 x daily - 7 x weekly - 1 sets - 10 reps - 5 hold - Seated Straight Leg Heel Taps  - 1-2 x daily - 7 x weekly - 3 sets - 10 reps   ASSESSMENT:   CLINICAL IMPRESSION: Patient is a 46 y.o. who  comes to clinic with complaints of bilateral knee pain, Rt ankle pain with mobility, strength and movement coordination deficits that impair their ability to perform usual daily and recreational functional activities without increase difficulty/symptoms at this time.  Patient to benefit from skilled PT services to address impairments and limitations to improve to previous level of function without restriction secondary to condition.      OBJECTIVE IMPAIRMENTS decreased activity tolerance, decreased balance, decreased coordination, decreased endurance, decreased mobility, difficulty walking, decreased strength, increased edema, impaired perceived functional ability, impaired flexibility, improper body mechanics, and pain.    ACTIVITY LIMITATIONS cleaning, community activity, laundry, and exercise routine .    PERSONAL FACTORS  Anxiety, anemia, depression, GERD, lymphedema hx  are also affecting patient's functional outcome.      REHAB POTENTIAL: Good   CLINICAL DECISION MAKING: Stable/uncomplicated   EVALUATION COMPLEXITY: Low     GOALS: Goals reviewed with patient? Yes   Short term PT Goals (target date for Short term goals are 3 weeks 07/11/2021) Patient will demonstrate independent use of home exercise program to maintain progress from in clinic treatments. Goal status: New   Long term PT goals (target dates for all long term goals are 10 weeks  08/29/2021 )   1. Patient will demonstrate/report pain at worst less than or equal to 2/10 to facilitate minimal limitation in daily activity secondary to pain symptoms. Goal status: New   2. Patient will demonstrate independent use of home exercise program to facilitate ability to maintain/progress functional gains from skilled physical therapy services. Goal status: New   3. Patient will demonstrate FOTO outcome > or = ____ % to indicate reduced disability due to condition.  (Take on 2nd visit and fill in) Goal status: New   4.  Patient  will demonstrate bilateral LE MMT 5/5, knee extension dynamometry increase >= 15 lbs to facilitate ability to perform usual standing, walking, stairs at PLOF s limitation due to symptoms. Goal status: New   5.  Patient will demonstrate SLS > 15 seconds to facilitate stability in ambulation on even and  uneven surfaces.    Goal status: New   6.  Patient will demonstrate ascending/descending stairs c reciprocal gait pattern for household navigation.   Goal status: New     PLAN: PT FREQUENCY: 1-2x/week   PT DURATION: 10 weeks   PLANNED INTERVENTIONS: Therapeutic exercises, Therapeutic activity, Neuro Muscular re-education, Balance training, Gait training, Patient/Family education, Joint mobilization, Stair training, DME instructions, Dry Needling, Electrical stimulation, Cryotherapy, Moist heat, Taping, Ultrasound, Ionotophoresis 4mg /ml Dexamethasone, and Manual therapy.  All included unless contraindicated   PLAN FOR NEXT SESSION:  TAKE FOTO (didn't complete visit 1) Review HEP knowledge.  Check ankle AROM.  Progress strengthening throughout LE c focus on avoiding WB pain generation.   , PT, DPT, OCS, ATC 07/07/21  8:48 AM

## 2021-07-18 ENCOUNTER — Encounter: Payer: Self-pay | Admitting: Physical Therapy

## 2021-07-18 ENCOUNTER — Encounter: Payer: 59 | Admitting: Physical Therapy

## 2021-07-18 ENCOUNTER — Ambulatory Visit (INDEPENDENT_AMBULATORY_CARE_PROVIDER_SITE_OTHER): Payer: 59 | Admitting: Physical Therapy

## 2021-07-18 DIAGNOSIS — M25571 Pain in right ankle and joints of right foot: Secondary | ICD-10-CM | POA: Diagnosis not present

## 2021-07-18 DIAGNOSIS — M25561 Pain in right knee: Secondary | ICD-10-CM | POA: Diagnosis not present

## 2021-07-18 DIAGNOSIS — M25562 Pain in left knee: Secondary | ICD-10-CM | POA: Diagnosis not present

## 2021-07-18 DIAGNOSIS — M6281 Muscle weakness (generalized): Secondary | ICD-10-CM

## 2021-07-18 DIAGNOSIS — R262 Difficulty in walking, not elsewhere classified: Secondary | ICD-10-CM

## 2021-07-18 DIAGNOSIS — G8929 Other chronic pain: Secondary | ICD-10-CM

## 2021-07-18 NOTE — Therapy (Signed)
OUTPATIENT PHYSICAL THERAPY TREATMENT NOTE   Patient Name: Kimberly Wells N Fread MRN: [id number] DOB:07-07-1975, 46 y.o., female Today's Date: 07/18/2021   END OF SESSION:   PT End of Session - 07/18/21 0901     Visit Number 2    Number of Visits 20    Date for PT Re-Evaluation 08/29/21    Authorization Type [employer] 10% coinsurance    PT Start Time 0850    PT Stop Time 0930    PT Time Calculation (min) 40 min    Activity Tolerance Patient tolerated treatment well    Behavior During Therapy WFL for tasks assessed/performed             Past Medical History:  Diagnosis Date   ADHD    Anemia    Anxiety    Asthma    B12 deficiency    Depression    Depression    Eczema    GERD (gastroesophageal reflux disease)    Joint pain    Kidney infection    Lactose intolerance    Lymphedema    Multiple food allergies    Sleep apnea    Vitamin D deficiency    Past Surgical History:  Procedure Laterality Date   TONSILLECTOMY     Patient Active Problem List   Diagnosis Date Noted   Depression 12/19/2012      THERAPY DIAG:  Chronic pain of left knee  Chronic pain of right knee  Pain in right ankle and joints of right foot  Muscle weakness (generalized)  Difficulty in walking, not elsewhere classified  Rationale for Evaluation and Treatment Rehabilitation   PCP: Jonah BlueStakes - Perry, Juel Burrowakia S. FNP   REFERRING PROVIDER: [place], [place]   REFERRING DIAG: M17.0 (ICD-10-CM) - Bilateral primary osteoarthritis of knee     ONSET DATE:  5 years.    SUBJECTIVE:    SUBJECTIVE STATEMENT: She states she has been traveling a lot and has had a hard time with getting in exercises and wants to know what she can do at hotel gyms she stays at. She wonders if she needs another knee injection or knee brace on her Rt knee   PERTINENT HISTORY: Anxiety, anemia, depression, GERD, lymphedema hx (bilateral leg since pregnancy)   PAIN:  Are you having pain? Yes: NPRS scale: moderate to  severe (no number given) Pain location: bilateral knee, Rt ankle Pain description: achy, sharp Aggravating factors: bending, squatting, prolonged standing/walking, stairs Relieving factors: injections in knees, massage   PRECAUTIONS: None   WEIGHT BEARING RESTRICTIONS No   FALLS:  Has patient fallen in last 6 months? No   LIVING ENVIRONMENT: Lives with: lives with their family Lives in: House/apartment Stairs: 3 stairs to enter house from garage with no rails., deck stairs.   OCCUPATION: Work from - sedentary   PLOF: Independent, reading   PATIENT GOALS Reduce pain, do some exercises for knees and ankles to get back to gym activity     OBJECTIVE:    PATIENT SURVEYS:  06/20/2021: FOTO not performed (take on 2nd visit)   COGNITION: 06/20/2021 Overall cognitive status: Within functional limits for tasks assessed                                 POSTURE:  06/20/2021 : increased body weight based off BMI   PALPATION: 06/20/2021 : check in future visits (limited by time)   LE ROM:   Active ROM Right  06/20/2021 Left 06/20/2021  Hip flexion      Hip extension      Hip abduction      Hip adduction      Hip internal rotation      Hip external rotation      Knee flexion 114 113  Knee extension 0 0  Ankle dorsiflexion      Ankle plantarflexion      Ankle inversion      Ankle eversion       (Blank rows = not tested)   LE MMT:   MMT Right 06/20/2021 Left 06/20/2021  Hip flexion 5/5 5/5  Hip extension      Hip abduction      Hip adduction      Hip internal rotation      Hip external rotation      Knee flexion      Knee extension 5/5 29.5, 30.5 lbs 5/5 33, 31.5 lbs  Ankle dorsiflexion 3/5 (5 reps single leg against gravity) 4/5 (10 reps single leg against gravity)  Ankle plantarflexion 5/5 5/5  Ankle inversion 4/5 4/5  Ankle eversion 4/5 5/5   (Blank rows = not tested)   FUNCTIONAL TESTS:  06/20/2021:         Lt SLS:             7 seconds       Rt SLS: < 3 seconds                          18 inch chair transfer:  s UE on 1st try no complaints   GAIT: 06/20/2021  :  independent ambulation       TODAY'S TREATMENT: 07/18/21 Seated SLR 2X10 bilat Recumbant bike L3 X 6 min Leg press machine DL 59# [id number] Leg curl machine DL 46# [id number] Leg extension machine DL 35# [id number] Supine quad stretch 3X30 sec bilat with strap, leg off EOB   06/20/2021 Therex:            HEP instruction/performance c cues for techniques, handout provided.  Trial set performed of each for comprehension and symptom assessment.  See below for exercise list.     PATIENT EDUCATION:  06/20/2021 Education details: HEP, POC Person educated: Patient Education method: Explanation, Demonstration, Tactile cues, Verbal cues, and Handouts Education comprehension: verbalized understanding, returned demonstration, verbal cues required, and tactile cues required     HOME EXERCISE PROGRAM: 06/20/2021 Access Code: [email or web address] URL: [email or web address] Date: 06/20/2021 Prepared by: Chyrel Masson   Exercises - Seated Long Arc Quad  - 2 x daily - 7 x weekly - 3 sets - 10 reps - 2 hold - [place]  - 3-5 x daily - 7 x weekly - 1 sets - 1-2 mins hold - Retro Step  - 3-5 x daily - 7 x weekly - 1 sets - 10-20 reps - Seated Quad Set  - 3-5 x daily - 7 x weekly - 1 sets - 10 reps - 5 hold - Seated Straight Leg Heel Taps  - 1-2 x daily - 7 x weekly - 3 sets - 10 reps   ASSESSMENT:   CLINICAL IMPRESSION: She wanted to know if she needs a knee brace as her Rt knee hurts more, I do feel she has good ligament stability without pain with special testing so not sure if she needs one but advised her that she can reach out to MD about their recommendation on this. We reviewed  HEP and gym activity today that she can use when she stays at hotels which will help further her progress with strengthening and PT.  Her FOTO functional survey was started but not completed and was taken out as a Non participant so I  removed the goal for this.      OBJECTIVE IMPAIRMENTS decreased activity tolerance, decreased balance, decreased coordination, decreased endurance, decreased mobility, difficulty walking, decreased strength, increased edema, impaired perceived functional ability, impaired flexibility, improper body mechanics, and pain.    ACTIVITY LIMITATIONS cleaning, community activity, laundry, and exercise routine .    PERSONAL FACTORS  Anxiety, anemia, depression, GERD, lymphedema hx  are also affecting patient's functional outcome.      REHAB POTENTIAL: Good   CLINICAL DECISION MAKING: Stable/uncomplicated   EVALUATION COMPLEXITY: Low     GOALS: Goals reviewed with patient? Yes   Short term PT Goals (target date for Short term goals are 3 weeks 07/11/2021) Patient will demonstrate independent use of home exercise program to maintain progress from in clinic treatments. Goal status: New   Long term PT goals (target dates for all long term goals are 10 weeks  08/29/2021 )   1. Patient will demonstrate/report pain at worst less than or equal to 2/10 to facilitate minimal limitation in daily activity secondary to pain symptoms. Goal status: New   2. Patient will demonstrate independent use of home exercise program to facilitate ability to maintain/progress functional gains from skilled physical therapy services. Goal status: New    3.  Patient will demonstrate bilateral LE MMT 5/5, knee extension dynamometry increase >= 15 lbs to facilitate ability to perform usual standing, walking, stairs at PLOF s limitation due to symptoms. Goal status: New   4.  Patient will demonstrate SLS > 15 seconds to facilitate stability in ambulation on even and uneven surfaces.    Goal status: New   5.  Patient will demonstrate ascending/descending stairs c reciprocal gait pattern for household navigation.   Goal status: New     PLAN: PT FREQUENCY: 1-2x/week   PT DURATION: 10 weeks   PLANNED INTERVENTIONS:  Therapeutic exercises, Therapeutic activity, Neuro Muscular re-education, Balance training, Gait training, Patient/Family education, Joint mobilization, Stair training, DME instructions, Dry Needling, Electrical stimulation, Cryotherapy, Moist heat, Taping, Ultrasound, Ionotophoresis 4mg /ml Dexamethasone, and Manual therapy.  All included unless contraindicated   PLAN FOR NEXT SESSION:  Progress strengthening throughout LE c focus on avoiding WB pain generation.     , PT,DPT 07/18/2021, 9:02 AM

## 2021-07-21 ENCOUNTER — Telehealth: Payer: Self-pay | Admitting: Family

## 2021-07-21 NOTE — Telephone Encounter (Signed)
Please copy 08/16/19 knee xrays to CD. Let me know when ready. I'll pick up.Thanks

## 2021-07-22 NOTE — Telephone Encounter (Signed)
Waiting for fedex label from 2nd MD

## 2021-07-22 NOTE — Telephone Encounter (Signed)
CD is ready for ya Tammy

## 2021-07-23 ENCOUNTER — Telehealth: Payer: Self-pay | Admitting: Family

## 2021-07-23 NOTE — Telephone Encounter (Signed)
Received vm from 2nd MD stating they received records but needs xray reports. IC,lmvm advised there are no separate report for the xrays. I stated to send [place] a fedex label for the images.ph [phone] ext [phone]

## 2021-07-25 ENCOUNTER — Encounter: Payer: 59 | Admitting: Physical Therapy

## 2021-07-29 ENCOUNTER — Encounter: Payer: Self-pay | Admitting: Physical Therapy

## 2021-07-29 ENCOUNTER — Ambulatory Visit (INDEPENDENT_AMBULATORY_CARE_PROVIDER_SITE_OTHER): Payer: 59 | Admitting: Physical Therapy

## 2021-07-29 DIAGNOSIS — G8929 Other chronic pain: Secondary | ICD-10-CM

## 2021-07-29 DIAGNOSIS — R262 Difficulty in walking, not elsewhere classified: Secondary | ICD-10-CM

## 2021-07-29 DIAGNOSIS — M6281 Muscle weakness (generalized): Secondary | ICD-10-CM | POA: Diagnosis not present

## 2021-07-29 DIAGNOSIS — M25571 Pain in right ankle and joints of right foot: Secondary | ICD-10-CM | POA: Diagnosis not present

## 2021-07-29 DIAGNOSIS — M25561 Pain in right knee: Secondary | ICD-10-CM | POA: Diagnosis not present

## 2021-07-29 DIAGNOSIS — M25562 Pain in left knee: Secondary | ICD-10-CM | POA: Diagnosis not present

## 2021-07-29 NOTE — Therapy (Signed)
OUTPATIENT PHYSICAL THERAPY TREATMENT NOTE   Patient Name: Kimberly Wells MRN: [id number] DOB:1975-10-03, 46 y.o., female Today's Date: 07/29/2021   END OF SESSION:   PT End of Session - 07/29/21 1420     Visit Number 3    Number of Visits 20    Date for PT Re-Evaluation 08/29/21    Authorization Type [employer] 10% coinsurance    PT Start Time 1348    PT Stop Time 1426    PT Time Calculation (min) 38 min    Activity Tolerance Patient tolerated treatment well    Behavior During Therapy WFL for tasks assessed/performed              Past Medical History:  Diagnosis Date   ADHD    Anemia    Anxiety    Asthma    B12 deficiency    Depression    Depression    Eczema    GERD (gastroesophageal reflux disease)    Joint pain    Kidney infection    Lactose intolerance    Lymphedema    Multiple food allergies    Sleep apnea    Vitamin D deficiency    Past Surgical History:  Procedure Laterality Date   TONSILLECTOMY     Patient Active Problem List   Diagnosis Date Noted   Depression 12/19/2012      THERAPY DIAG:  Chronic pain of left knee  Chronic pain of right knee  Pain in right ankle and joints of right foot  Muscle weakness (generalized)  Difficulty in walking, not elsewhere classified  Rationale for Evaluation and Treatment Rehabilitation   PCP: Jonah Blue, Juel Burrow. FNP   REFERRING PROVIDER: Persons, [place], [place]   REFERRING DIAG: M17.0 (ICD-10-CM) - Bilateral primary osteoarthritis of knee     ONSET DATE:  5 years.    SUBJECTIVE:    SUBJECTIVE STATEMENT: She states she is not having pain today upon arrival, she has been compliant with HEP and she wants to review gym equipment again that she can do at hotel gyms.   PERTINENT HISTORY: Anxiety, anemia, depression, GERD, lymphedema hx (bilateral leg since pregnancy)   PAIN:  Are you having pain? Yes: NPRS scale: moderate to severe (no number given) Pain location: bilateral knee, Rt  ankle Pain description: achy, sharp Aggravating factors: bending, squatting, prolonged standing/walking, stairs Relieving factors: injections in knees, massage   PRECAUTIONS: None   WEIGHT BEARING RESTRICTIONS No   FALLS:  Has patient fallen in last 6 months? No   LIVING ENVIRONMENT: Lives with: lives with their family Lives in: House/apartment Stairs: 3 stairs to enter house from garage with no rails., deck stairs.   OCCUPATION: Work from - sedentary   PLOF: Independent, reading   PATIENT GOALS Reduce pain, do some exercises for knees and ankles to get back to gym activity     OBJECTIVE:    PATIENT SURVEYS:  06/20/2021: FOTO staff did not capture, was closed out.   COGNITION: 06/20/2021 Overall cognitive status: Within functional limits for tasks assessed                                 POSTURE:  06/20/2021 : increased body weight based off BMI   PALPATION: 06/20/2021 : check in future visits (limited by time)   LE ROM:   Active ROM Right 06/20/2021 Left 06/20/2021  Hip flexion      Hip extension  Hip abduction      Hip adduction      Hip internal rotation      Hip external rotation      Knee flexion 114 113  Knee extension 0 0  Ankle dorsiflexion      Ankle plantarflexion      Ankle inversion      Ankle eversion       (Blank rows = not tested)   LE MMT:   MMT Right 06/20/2021 Left 06/20/2021  Hip flexion 5/5 5/5  Hip extension      Hip abduction      Hip adduction      Hip internal rotation      Hip external rotation      Knee flexion      Knee extension 5/5 29.5, 30.5 lbs 5/5 33, 31.5 lbs  Ankle dorsiflexion 3/5 (5 reps single leg against gravity) 4/5 (10 reps single leg against gravity)  Ankle plantarflexion 5/5 5/5  Ankle inversion 4/5 4/5  Ankle eversion 4/5 5/5   (Blank rows = not tested)   FUNCTIONAL TESTS:  06/20/2021:         Lt SLS:             7 seconds       Rt SLS: < 3 seconds                         18 inch chair transfer:  s UE on 1st  try no complaints   GAIT: 06/20/2021  :  independent ambulation       TODAY'S TREATMENT: 07/29/21 Recumbent bike L3 X 8 min Seated SLR 2X10 bilat Leg press machine DL 09#81# [id number] Leg curl machine DL 14#35# [id number] Leg extension machine DL 56#10# [id number] Step ups on 6 inch step with one UE support X 10 bilat Supine quad stretch 3X30 sec bilat with strap, leg off EOB    PATIENT EDUCATION:  06/20/2021 Education details: HEP, POC Person educated: Patient Education method: Explanation, Demonstration, Tactile cues, Verbal cues, and Handouts Education comprehension: verbalized understanding, returned demonstration, verbal cues required, and tactile cues required     HOME EXERCISE PROGRAM: 06/20/2021 Access Code: [email or web address] URL: [email or web address] Date: 06/20/2021 Prepared by: Chyrel MassonMichael Wright   Exercises - Seated Long Arc Quad  - 2 x daily - 7 x weekly - 3 sets - 10 reps - 2 hold - [place]  - 3-5 x daily - 7 x weekly - 1 sets - 1-2 mins hold - Retro Step  - 3-5 x daily - 7 x weekly - 1 sets - 10-20 reps - Seated Quad Set  - 3-5 x daily - 7 x weekly - 1 sets - 10 reps - 5 hold - Seated Straight Leg Heel Taps  - 1-2 x daily - 7 x weekly - 3 sets - 10 reps   ASSESSMENT:   CLINICAL IMPRESSION: She is showing some early progress with PT and in general has been more compliant with HEP. We again reviewed gym machines that she can use at hotels she stays at when traveling which will help facilitate strengthening and ROM in addition to her HEP. This was the last visit she had scheduled and will trial independent program for now. She will contact PT if she feels she needs to set up another appointment.     OBJECTIVE IMPAIRMENTS decreased activity tolerance, decreased balance, decreased coordination, decreased endurance, decreased mobility, difficulty walking, decreased strength, increased edema, impaired perceived  functional ability, impaired flexibility, improper body mechanics, and pain.     ACTIVITY LIMITATIONS cleaning, community activity, laundry, and exercise routine .    PERSONAL FACTORS  Anxiety, anemia, depression, GERD, lymphedema hx  are also affecting patient's functional outcome.      REHAB POTENTIAL: Good   CLINICAL DECISION MAKING: Stable/uncomplicated   EVALUATION COMPLEXITY: Low     GOALS: Goals reviewed with patient? Yes   Short term PT Goals (target date for Short term goals are 3 weeks 07/11/2021) Patient will demonstrate independent use of home exercise program to maintain progress from in clinic treatments. Goal status: New   Long term PT goals (target dates for all long term goals are 10 weeks  08/29/2021 )   1. Patient will demonstrate/report pain at worst less than or equal to 2/10 to facilitate minimal limitation in daily activity secondary to pain symptoms. Goal status: New   2. Patient will demonstrate independent use of home exercise program to facilitate ability to maintain/progress functional gains from skilled physical therapy services. Goal status: New    3.  Patient will demonstrate bilateral LE MMT 5/5, knee extension dynamometry increase >= 15 lbs to facilitate ability to perform usual standing, walking, stairs at PLOF s limitation due to symptoms. Goal status: New   4.  Patient will demonstrate SLS > 15 seconds to facilitate stability in ambulation on even and uneven surfaces.    Goal status: New   5.  Patient will demonstrate ascending/descending stairs c reciprocal gait pattern for household navigation.   Goal status: New     PLAN: PT FREQUENCY: 1-2x/week   PT DURATION: 10 weeks   PLANNED INTERVENTIONS: Therapeutic exercises, Therapeutic activity, Neuro Muscular re-education, Balance training, Gait training, Patient/Family education, Joint mobilization, Stair training, DME instructions, Dry Needling, Electrical stimulation, Cryotherapy, Moist heat, Taping, Ultrasound, Ionotophoresis 4mg /ml Dexamethasone, and Manual therapy.   All included unless contraindicated   PLAN FOR NEXT SESSION:  Progress strengthening as tolerated    , PT,DPT 07/29/2021, 2:22 PM

## 2021-08-04 ENCOUNTER — Ambulatory Visit: Payer: 59 | Admitting: Physician Assistant

## 2021-08-26 ENCOUNTER — Encounter: Payer: 59 | Admitting: Physical Therapy

## 2021-09-24 ENCOUNTER — Encounter (INDEPENDENT_AMBULATORY_CARE_PROVIDER_SITE_OTHER): Payer: Self-pay

## 2021-10-15 ENCOUNTER — Ambulatory Visit: Payer: No Typology Code available for payment source | Admitting: Allergy

## 2021-11-13 ENCOUNTER — Ambulatory Visit: Payer: 59 | Admitting: Obstetrics and Gynecology

## 2021-12-02 ENCOUNTER — Ambulatory Visit: Payer: 59 | Attending: Nurse Practitioner | Admitting: Occupational Therapy

## 2021-12-04 ENCOUNTER — Ambulatory Visit: Payer: 59 | Admitting: Occupational Therapy

## 2021-12-09 ENCOUNTER — Encounter: Payer: 59 | Admitting: Occupational Therapy

## 2021-12-11 ENCOUNTER — Ambulatory Visit: Payer: 59 | Admitting: Occupational Therapy

## 2021-12-18 ENCOUNTER — Ambulatory Visit: Payer: 59 | Admitting: Occupational Therapy

## 2021-12-19 ENCOUNTER — Ambulatory Visit: Payer: 59 | Admitting: Occupational Therapy

## 2021-12-26 IMAGING — US US ABDOMEN LIMITED
1 series · 14 of 25 positions shown · non-contrast
Comparison: None.

CLINICAL DATA: Upper abdominal pain x3 weeks.

EXAM:
ULTRASOUND ABDOMEN LIMITED RIGHT UPPER QUADRANT

[Series 1: us abdomen limited · 14 of 34 slices shown]
[im 1/34]
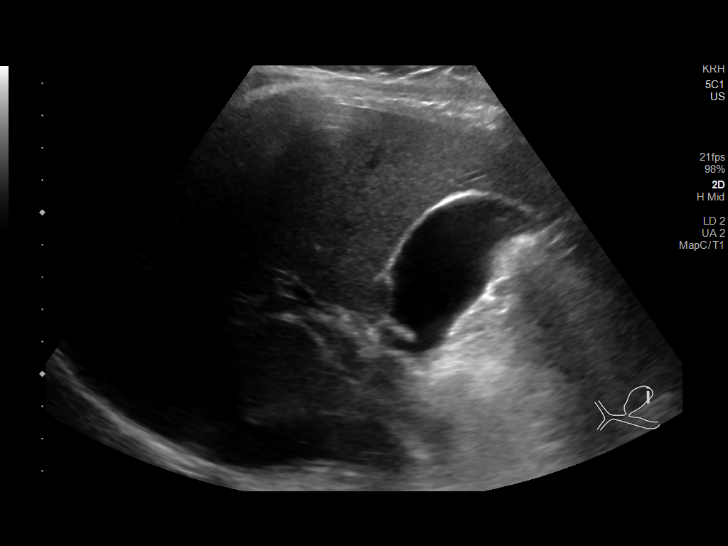
[im 3/34]
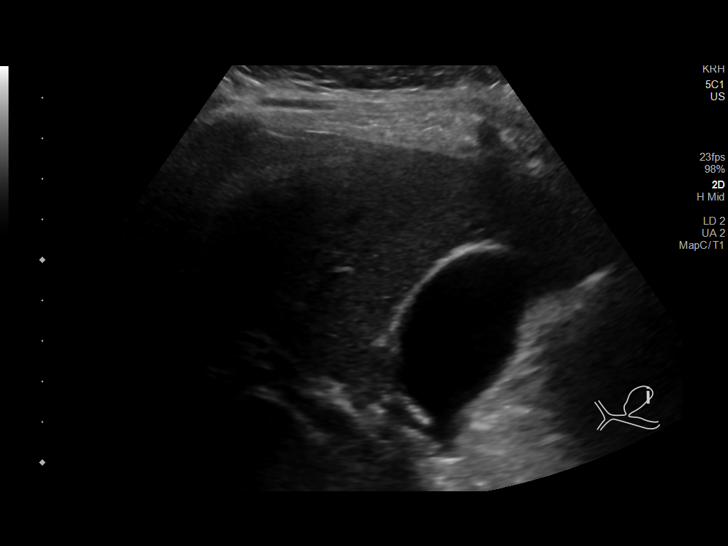
[im 6/34]
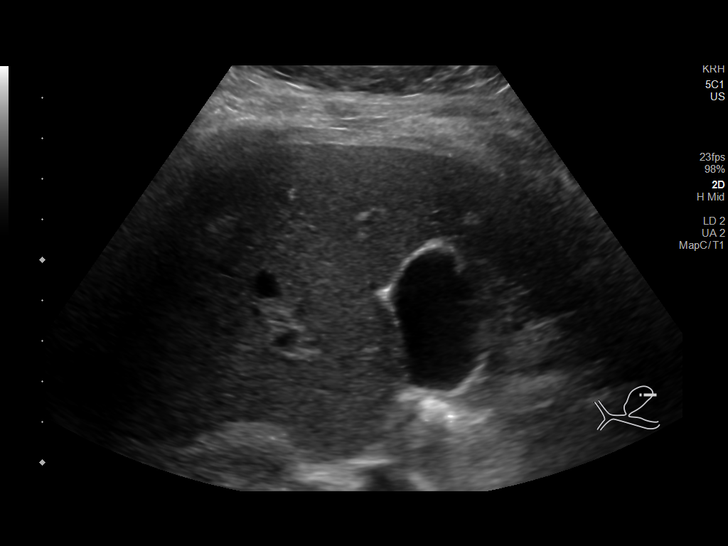
[im 9/34]
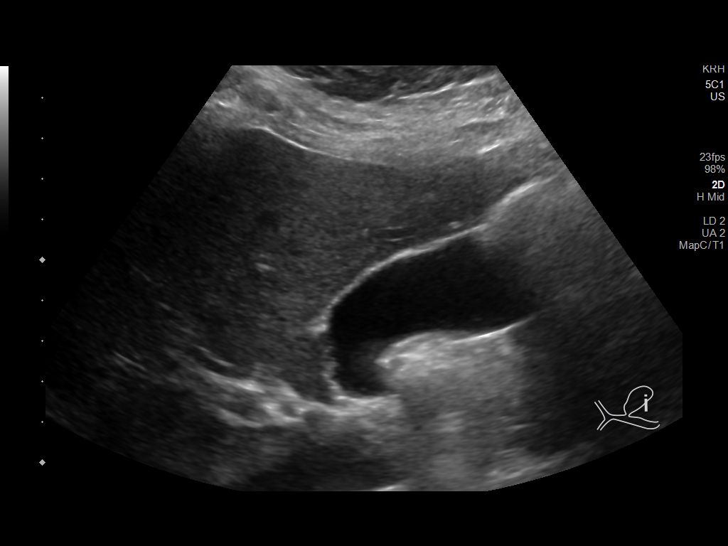
[im 12/34]
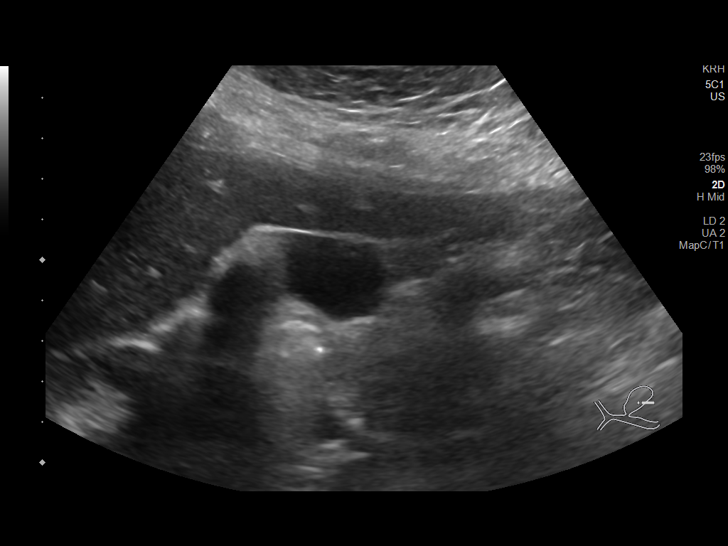
[im 13/34]
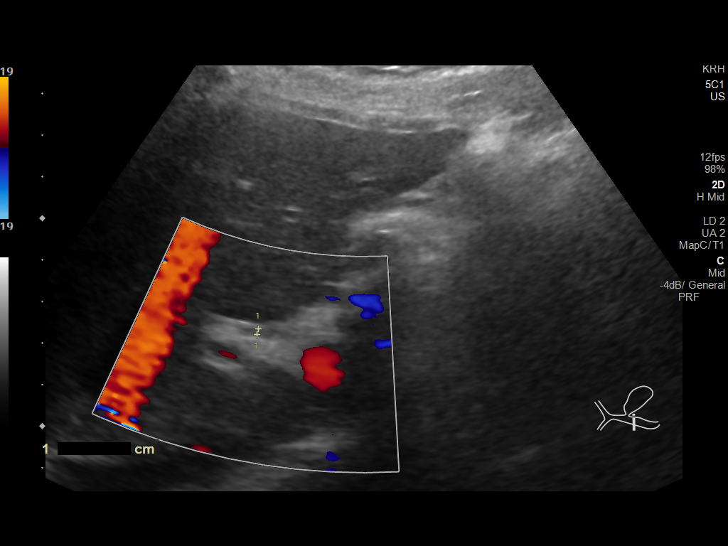
[im 16/34]
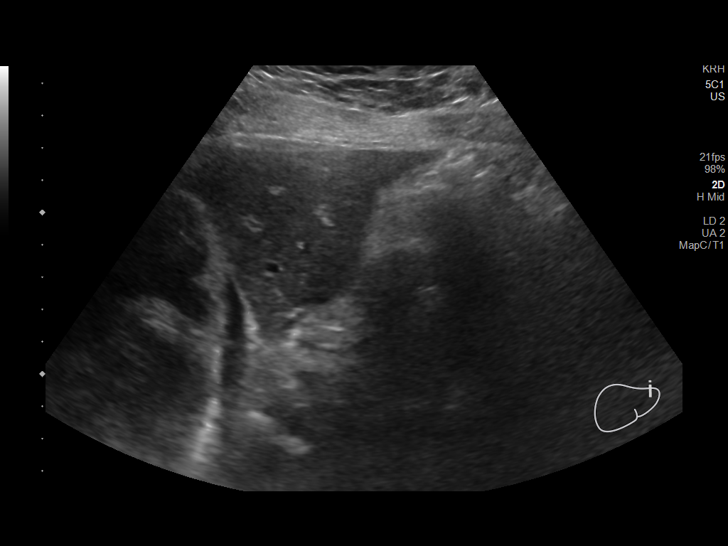
[im 18/34]
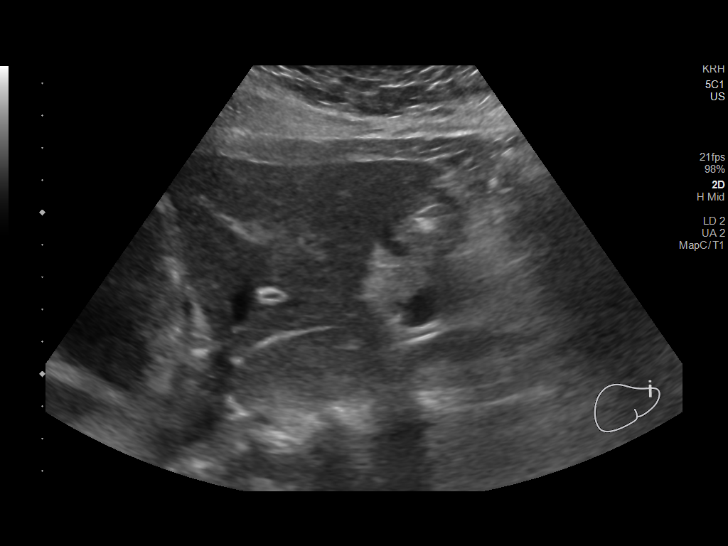
[im 21/34]
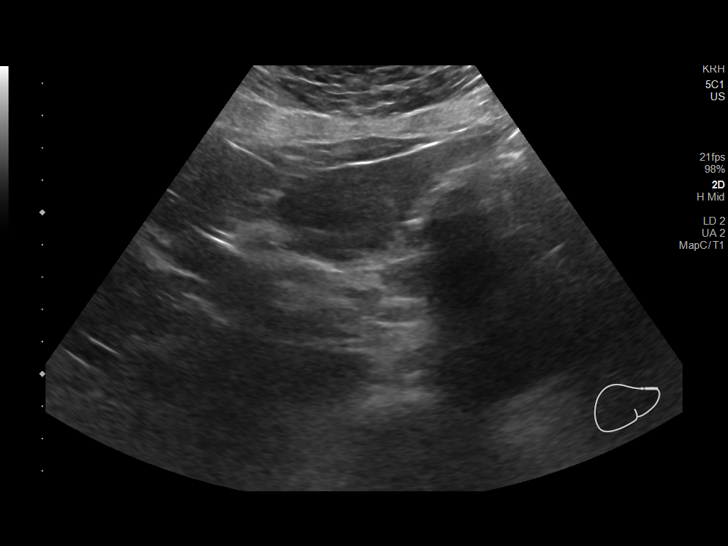
[im 23/34]
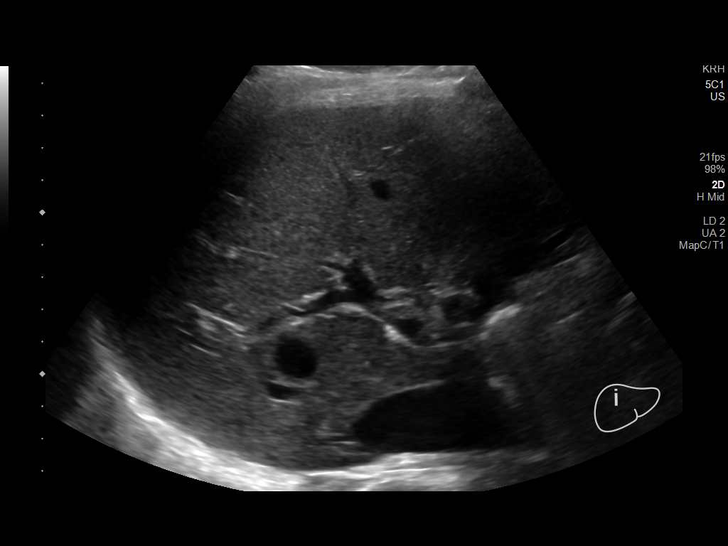
[im 25/34]
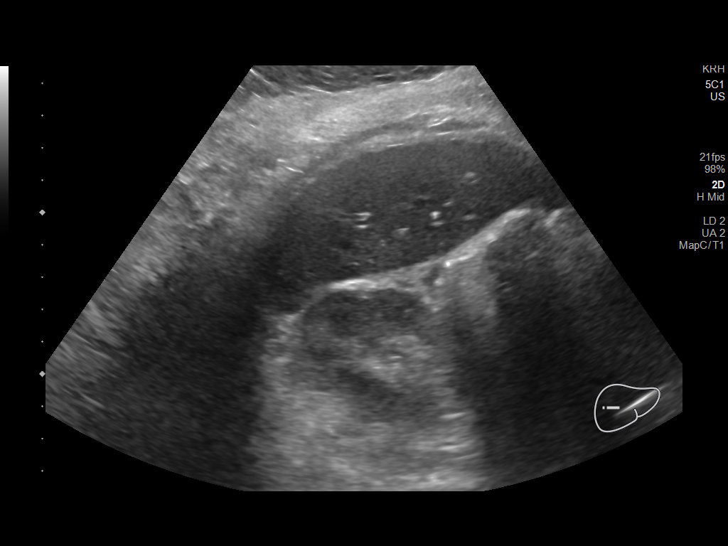
[im 28/34]
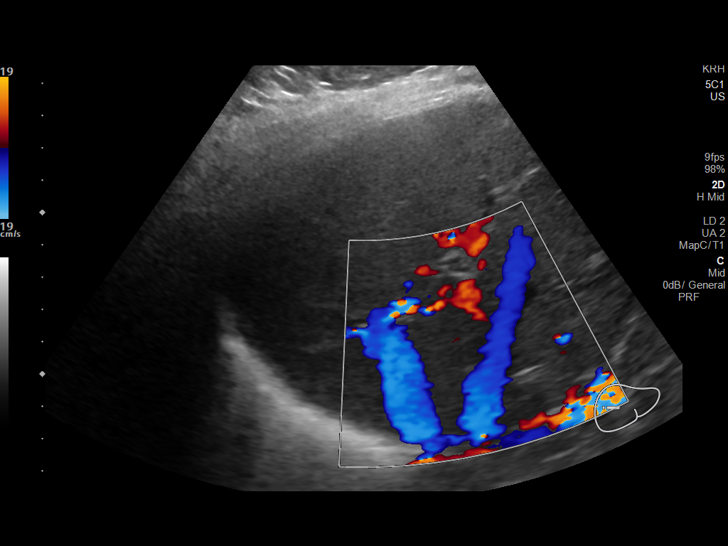
[im 31/34]
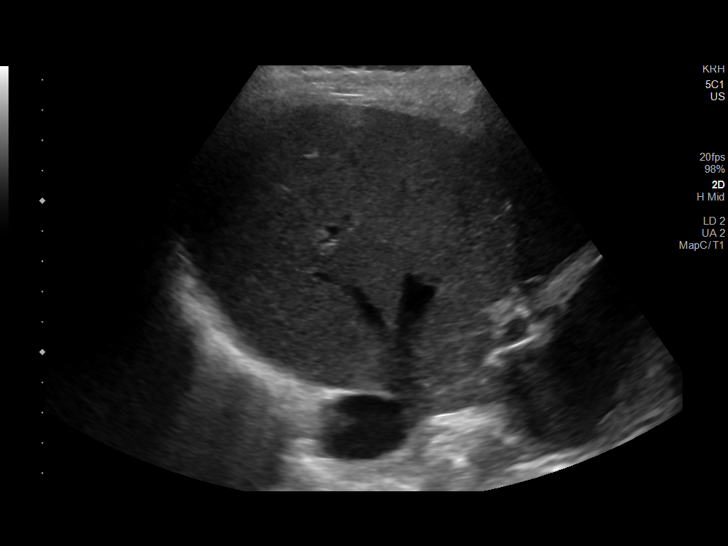
[im 34/34]
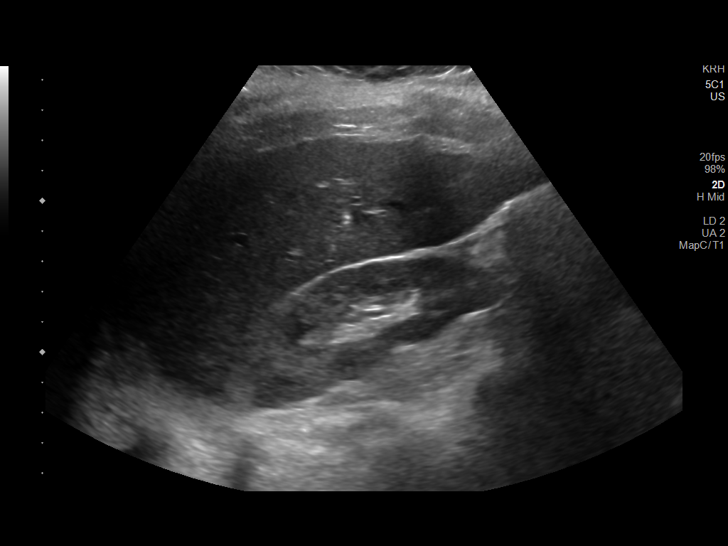

[14 of 25 positions shown; findings below may reference images not displayed]

FINDINGS: Gallbladder:

The gallbladder is limited in evaluation secondary to overlying
bowel gas. No gallstones or wall thickening visualized (2.1 mm). No
sonographic Murphy sign noted by sonographer.

Common bile duct:

Diameter: 1.4 mm (limited in evaluation secondary to overlying bowel
gas)

Liver:

No focal lesion identified. Within normal limits in parenchymal
echogenicity. Portal vein is patent on color Doppler imaging with
normal direction of blood flow towards the liver.

Other: None.
IMPRESSION: 1. Limited study secondary to overlying bowel gas.
2. No evidence of cholelithiasis or acute cholecystitis.

## 2022-04-01 ENCOUNTER — Ambulatory Visit
Admission: EM | Admit: 2022-04-01 | Discharge: 2022-04-01 | Disposition: A | Discharge status: Home / Self Care | Payer: 59 | Attending: Internal Medicine | Admitting: Internal Medicine

## 2022-04-01 DIAGNOSIS — Z1152 Encounter for screening for COVID-19: Secondary | ICD-10-CM | POA: Diagnosis not present

## 2022-04-01 DIAGNOSIS — J069 Acute upper respiratory infection, unspecified: Secondary | ICD-10-CM | POA: Insufficient documentation

## 2022-04-01 DIAGNOSIS — J029 Acute pharyngitis, unspecified: Secondary | ICD-10-CM | POA: Insufficient documentation

## 2022-04-01 LAB — POCT RAPID STREP A (OFFICE): Rapid Strep A Screen: NEGATIVE

## 2022-04-01 LAB — POCT INFLUENZA A/B
Influenza A, POC: NEGATIVE
Influenza B, POC: NEGATIVE

## 2022-04-01 MED ORDER — FLUTICASONE PROPIONATE 50 MCG/ACT NA SUSP: 1.0000 | Freq: Every day | NASAL | 0 refills | Status: AC

## 2022-04-01 MED ORDER — BENZONATATE 100 MG PO CAPS: 100.0000 mg | ORAL_CAPSULE | Freq: Three times a day (TID) | ORAL | 0 refills | Status: DC | PRN

## 2022-04-01 MED ORDER — ALBUTEROL SULFATE HFA 108 (90 BASE) MCG/ACT IN AERS: 1.0000 | INHALATION_SPRAY | Freq: Four times a day (QID) | RESPIRATORY_TRACT | 0 refills | Status: AC | PRN

## 2022-04-01 MED ORDER — CHLORASEPTIC 1.4 % MT LIQD: 1.0000 | OROMUCOSAL | 0 refills | Status: DC | PRN

## 2022-04-01 NOTE — ED Triage Notes (Signed)
Pt c/o nasal congestion, fever, body aches, and swollen lymph nodes behind ears since Monday. Taking OTC with no relief.

## 2022-04-01 NOTE — Discharge Instructions (Addendum)
Rapid strep and rapid flu are negative.  Throat culture and COVID test are pending.  It appears that you have a viral illness that should run its course and self resolve with the help with symptomatic treatment.  I have prescribed you 3 medications to help alleviate symptoms.  Please follow-up if any symptoms persist or worsen.

## 2022-04-01 NOTE — ED Provider Notes (Signed)
[name]    CSN: [id number] Arrival date & time: 04/01/22  1718      History   Chief Complaint Chief Complaint  Patient presents with   Fever   Nasal Congestion    HPI Kimberly Wells is a 47 y.o. female.   Patient presents with nasal congestion, fever, generalized bodyaches, swollen lymph nodes behind the ear, sore throat, cough that started about 3 days ago.  Patient not sure of Tmax at home but states that she felt feverish.  Reports that her daughter was possibly exposed to COVID-19 at school.  Patient has been taking over-the-counter cold and flu medications with minimal improvement of symptoms.  Reports history of asthma but has not needed albuterol inhaler.  She is requesting refill on inhaler as she does not have one at home.  Denies chest pain, shortness of breath, nausea, vomiting, diarrhea, abdominal pain.   Fever   Past Medical History:  Diagnosis Date   ADHD    Anemia    Anxiety    Asthma    B12 deficiency    Depression    Depression    Eczema    GERD (gastroesophageal reflux disease)    Joint pain    Kidney infection    Lactose intolerance    Lymphedema    Multiple food allergies    Sleep apnea    Vitamin D deficiency     Patient Active Problem List   Diagnosis Date Noted   Depression 12/19/2012    Past Surgical History:  Procedure Laterality Date   TONSILLECTOMY      OB History     Gravida  3   Para      Term      Preterm      AB      Living  1      SAB      IAB      Ectopic      Multiple      Live Births               Home Medications    Prior to Admission medications   Medication Sig Start Date End Date Taking? Authorizing Provider  albuterol (VENTOLIN HFA) 108 (90 Base) MCG/ACT inhaler Inhale 1-2 puffs into the lungs every 6 (six) hours as needed for wheezing or shortness of breath. 04/01/22  Yes , Hildred Alamin E, FNP  benzonatate (TESSALON) 100 MG capsule Take 1 capsule (100 mg total) by  mouth every 8 (eight) hours as needed for cough. 04/01/22  Yes , Hildred Alamin E, FNP  fluticasone (FLONASE) 50 MCG/ACT nasal spray Place 1 spray into both nostrils daily. 04/01/22  Yes , Hildred Alamin E, FNP  phenol (CHLORASEPTIC) 1.4 % LIQD Use as directed 1 spray in the mouth or throat as needed for throat irritation / pain. 04/01/22  Yes , Hildred Alamin E, FNP  budesonide-formoterol (SYMBICORT) 160-4.5 MCG/ACT inhaler Inhale 2 puffs into the lungs 2 (two) times daily. 10/06/19   Kennith Gain, MD  dexmethylphenidate (FOCALIN XR) 10 MG 24 hr capsule Take 20 mg by mouth daily. 06/19/19   [provider]  esomeprazole (NEXIUM) 40 MG capsule TAKE 1 CAPSULE (40 MG TOTAL) BY MOUTH DAILY AT 12 NOON. 10/02/19   Briscoe Deutscher, DO  ferrous sulfate 325 (65 FE) MG EC tablet Take by mouth. 10/27/18   [provider]  furosemide (LASIX) 20 MG tablet Take 10 mg by mouth daily.  10/27/18   [provider]  meloxicam (  MOBIC) 15 MG tablet Take 1 tablet (15 mg total) by mouth daily. 06/09/21 06/09/22  Persons, Bevely Palmer, PA  metFORMIN (GLUCOPHAGE) 500 MG tablet TAKE 1 TABLET BY MOUTH EVERY DAY WITH BREAKFAST 10/02/19   Briscoe Deutscher, DO  omeprazole (PRILOSEC) 20 MG capsule Take 1 capsule (20 mg total) by mouth daily. 10/06/19 11/05/19  Volanda Napoleon, PA-C  sucralfate (CARAFATE) 1 g tablet Take 1 tablet (1 g total) by mouth 4 (four) times daily -  with meals and at bedtime for 21 days. 10/06/19 10/27/19  Volanda Napoleon, PA-C  topiramate (TOPAMAX) 50 MG tablet TAKE 1 TABLET (50 MG TOTAL) BY MOUTH DAILY WITH SUPPER. 10/02/19   Briscoe Deutscher, DO    Family History Family History  Problem Relation Age of Onset   Depression Mother    Anxiety disorder Mother    Hypertension Mother    Hyperlipidemia Mother    Heart disease Mother    Obesity Mother    Depression Father     Social History Social History   Tobacco Use   Smoking status: Never   Smokeless tobacco: Never  Substance Use  Topics   Alcohol use: No   Drug use: No     Allergies   Patient has no known allergies.   Review of Systems Review of Systems Per HPI  Physical Exam Triage Vital Signs ED Triage Vitals  Enc Vitals Group     BP 04/01/22 1744 126/85     Pulse Rate 04/01/22 1744 74     Resp --      Temp 04/01/22 1744 99 F (37.2 C)     Temp Source 04/01/22 1744 Oral     SpO2 04/01/22 1744 98 %     Weight --      Height --      Head Circumference --      Peak Flow --      Pain Score 04/01/22 1754 5     Pain Loc --      Pain Edu? --      Excl. in [place]? --    No data found.  Updated Vital Signs BP 126/85 (BP Location: Left Arm)   Pulse 74   Temp 99 F (37.2 C) (Oral)   SpO2 98%   Visual Acuity Right Eye Distance:   Left Eye Distance:   Bilateral Distance:    Right Eye Near:   Left Eye Near:    Bilateral Near:     Physical Exam Constitutional:      General: She is not in acute distress.    Appearance: Normal appearance. She is not toxic-appearing or diaphoretic.  HENT:     Head: Normocephalic and atraumatic.     Right Ear: Tympanic membrane and ear canal normal.     Left Ear: Tympanic membrane and ear canal normal.     Nose: Congestion present.     Mouth/Throat:     Mouth: Mucous membranes are moist.     Pharynx: Posterior oropharyngeal erythema present.  Eyes:     Extraocular Movements: Extraocular movements intact.     Conjunctiva/sclera: Conjunctivae normal.     Pupils: Pupils are equal, round, and reactive to light.  Cardiovascular:     Rate and Rhythm: Normal rate and regular rhythm.     Pulses: Normal pulses.     Heart sounds: Normal heart sounds.  Pulmonary:     Effort: Pulmonary effort is normal. No respiratory distress.     Breath sounds: Normal breath  sounds. No stridor. No wheezing, rhonchi or rales.  Abdominal:     General: Abdomen is flat. Bowel sounds are normal.     Palpations: Abdomen is soft.  Musculoskeletal:        General: Normal range of  motion.     Cervical back: Normal range of motion.  Skin:    General: Skin is warm and dry.  Neurological:     General: No focal deficit present.     Mental Status: She is alert and oriented to person, place, and time. Mental status is at baseline.  Psychiatric:        Mood and Affect: Mood normal.        Behavior: Behavior normal.      UC Treatments / Results  Labs (all labs ordered are listed, but only abnormal results are displayed) Labs Reviewed  CULTURE, GROUP A STREP ([place])  SARS CORONAVIRUS 2 (TAT 6-24 HRS)  POCT RAPID STREP A (OFFICE)  POCT INFLUENZA A/B    EKG   Radiology No results found.  Procedures Procedures (including critical care time)  Medications Ordered in UC Medications - No data to display  Initial Impression / Assessment and Plan / UC Course  I have reviewed the triage vital signs and the nursing notes.  Pertinent labs & imaging results that were available during my care of the patient were reviewed by me and considered in my medical decision making (see chart for details).     Patient presents with symptoms likely from a viral upper respiratory infection.  Do not suspect underlying cardiopulmonary process. Symptoms seem unlikely related to ACS, CHF or COPD exacerbations, pneumonia, pneumothorax. Patient is nontoxic appearing and not in need of emergent medical intervention.  Rapid flu and rapid strep are negative.  Throat culture and COVID test pending.  Recommended symptom control with medications and supportive care.  Patient was sent prescriptions and albuterol inhaler refilled.  Return if symptoms fail to improve. Patient states understanding and is agreeable.  Discharged with PCP followup.  Final Clinical Impressions(s) / UC Diagnoses   Final diagnoses:  Viral upper respiratory tract infection with cough  Sore throat     Discharge Instructions      Rapid strep and rapid flu are negative.  Throat culture and COVID test are  pending.  It appears that you have a viral illness that should run its course and self resolve with the help with symptomatic treatment.  I have prescribed you 3 medications to help alleviate symptoms.  Please follow-up if any symptoms persist or worsen.    ED Prescriptions     Medication Sig Dispense Auth. Provider   benzonatate (TESSALON) 100 MG capsule Take 1 capsule (100 mg total) by mouth every 8 (eight) hours as needed for cough. 21 capsule [place], Hardwick E, [place]   phenol (CHLORASEPTIC) 1.4 % LIQD Use as directed 1 spray in the mouth or throat as needed for throat irritation / pain. 118 mL , Hildred Alamin E, FNP   fluticasone [facility]) 50 MCG/ACT nasal spray Place 1 spray into both nostrils daily. 16 g Oswaldo Conroy E, [place]   albuterol (VENTOLIN HFA) 108 (90 Base) MCG/ACT inhaler Inhale 1-2 puffs into the lungs every 6 (six) hours as needed for wheezing or shortness of breath. 1 each Teodora Medici, [place]      PDMP not reviewed this encounter.   Teodora Medici, [place] 04/01/22 [phone]

## 2022-04-02 LAB — SARS CORONAVIRUS 2 (TAT 6-24 HRS): SARS Coronavirus 2: NEGATIVE

## 2022-04-04 LAB — CULTURE, GROUP A STREP (THRC)

## 2022-04-05 ENCOUNTER — Ambulatory Visit
Admission: EM | Admit: 2022-04-05 | Discharge: 2022-04-05 | Disposition: A | Discharge status: Home / Self Care | Payer: 59 | Attending: Emergency Medicine | Admitting: Emergency Medicine

## 2022-04-05 DIAGNOSIS — J069 Acute upper respiratory infection, unspecified: Secondary | ICD-10-CM | POA: Diagnosis not present

## 2022-04-05 MED ORDER — AZITHROMYCIN 250 MG PO TABS: 250.0000 mg | ORAL_TABLET | Freq: Every day | ORAL | 0 refills | Status: DC

## 2022-04-05 MED ORDER — PROMETHAZINE-DM 6.25-15 MG/5ML PO SYRP: 5.0000 mL | ORAL_SOLUTION | Freq: Every evening | ORAL | 0 refills | Status: DC | PRN

## 2022-04-05 MED ORDER — PREDNISONE 20 MG PO TABS: 40.0000 mg | ORAL_TABLET | Freq: Every day | ORAL | 0 refills | Status: DC

## 2022-04-05 NOTE — ED Triage Notes (Signed)
Pt presents with productive cough with colored mucous, wheezing,  post nasal drainage, congestion, and hoarseness for over a week that is unrelieved with inhaler, prescribed cough suppressant, and nasal spray.

## 2022-04-05 NOTE — Discharge Instructions (Signed)
Begin use of azithromycin take as directed, this will provide coverage for bacteria  Begin use of prednisone every morning with food for 5 days this reduces inflammation and irritation to the airway which should help calm your cough as well as your wheezing  You may use cough syrup at bedtime for additional comfort, be mindful this does make you drowsy    You can take Tylenol and/or Ibuprofen as needed for fever reduction and pain relief.   For cough: honey 1/2 to 1 teaspoon (you can dilute the honey in water or another fluid).  You can also use guaifenesin and dextromethorphan for cough. You can use a humidifier for chest congestion and cough.  If you don't have a humidifier, you can sit in the bathroom with the hot shower running.      For sore throat: try warm salt water gargles, cepacol lozenges, throat spray, warm tea or water with lemon/honey, popsicles or ice, or OTC cold relief medicine for throat discomfort.   For congestion: take a daily anti-histamine like Zyrtec, Claritin, and a oral decongestant, such as pseudoephedrine.  You can also use Flonase 1-2 sprays in each nostril daily.   It is important to stay hydrated: drink plenty of fluids (water, gatorade/powerade/pedialyte, juices, or teas) to keep your throat moisturized and help further relieve irritation/discomfort.

## 2022-04-05 NOTE — ED Provider Notes (Signed)
[name]    CSN: [id number] Arrival date & time: 04/05/22  1126      History   Chief Complaint Chief Complaint  Patient presents with   URI    HPI MABELLE GLUCKSMAN is a 47 y.o. female.    \Patient presents for evaluation of fever, chills, body aches, nasal congestion, rhinorrhea, postnasal drip, right-sided ear pain, cough and wheezing present for 7 days.  Fever chills and bodyaches have resolved.  Cough is productive with yellow to green sputum, worsening at night interfering with sleep.  Wheezing occurring intermittently throughout the day.  Was evaluated 4 days ago in urgent care, has taken prescribed medication with no relief.  Additionally has attempted use of Cepacol and Ezekwe lozenges which have been ineffective.  History of asthma.  Denies shortness of breath, chest pain or tightness.  Past Medical History:  Diagnosis Date   ADHD    Anemia    Anxiety    Asthma    B12 deficiency    Depression    Depression    Eczema    GERD (gastroesophageal reflux disease)    Joint pain    Kidney infection    Lactose intolerance    Lymphedema    Multiple food allergies    Sleep apnea    Vitamin D deficiency     Patient Active Problem List   Diagnosis Date Noted   Depression 12/19/2012    Past Surgical History:  Procedure Laterality Date   TONSILLECTOMY      OB History     Gravida  3   Para      Term      Preterm      AB      Living  1      SAB      IAB      Ectopic      Multiple      Live Births               Home Medications    Prior to Admission medications   Medication Sig Start Date End Date Taking? Authorizing Provider  albuterol (VENTOLIN HFA) 108 (90 Base) MCG/ACT inhaler Inhale 1-2 puffs into the lungs every 6 (six) hours as needed for wheezing or shortness of breath. 04/01/22   Teodora Medici, FNP  benzonatate (TESSALON) 100 MG capsule Take 1 capsule (100 mg total) by mouth every 8 (eight) hours as needed for  cough. 04/01/22   Teodora Medici, FNP  budesonide-formoterol [facility]) 160-4.5 MCG/ACT inhaler Inhale 2 puffs into the lungs 2 (two) times daily. 10/06/19   Kennith Gain, MD  dexmethylphenidate (FOCALIN XR) 10 MG 24 hr capsule Take 20 mg by mouth daily. 06/19/19   [provider]  esomeprazole (NEXIUM) 40 MG capsule TAKE 1 CAPSULE (40 MG TOTAL) BY MOUTH DAILY AT 12 NOON. 10/02/19   Briscoe Deutscher, DO  ferrous sulfate 325 (65 FE) MG EC tablet Take by mouth. 10/27/18   [provider]  fluticasone (FLONASE) 50 MCG/ACT nasal spray Place 1 spray into both nostrils daily. 04/01/22   Teodora Medici, FNP  furosemide (LASIX) 20 MG tablet Take 10 mg by mouth daily.  10/27/18   [provider]  meloxicam (MOBIC) 15 MG tablet Take 1 tablet (15 mg total) by mouth daily. 06/09/21 06/09/22  Persons, Bevely Palmer, PA  metFORMIN (GLUCOPHAGE) 500 MG tablet TAKE 1 TABLET BY MOUTH EVERY DAY WITH BREAKFAST 10/02/19   Briscoe Deutscher, DO  omeprazole (  PRILOSEC) 20 MG capsule Take 1 capsule (20 mg total) by mouth daily. 10/06/19 11/05/19  [place] Lanius A, PA-C  phenol (CHLORASEPTIC) 1.4 % LIQD Use as directed 1 spray in the mouth or throat as needed for throat irritation / pain. 04/01/22   Teodora Medici, FNP  sucralfate (CARAFATE) 1 g tablet Take 1 tablet (1 g total) by mouth 4 (four) times daily -  with meals and at bedtime for 21 days. 10/06/19 10/27/19  Volanda Napoleon, PA-C  topiramate (TOPAMAX) 50 MG tablet TAKE 1 TABLET (50 MG TOTAL) BY MOUTH DAILY WITH SUPPER. 10/02/19   Briscoe Deutscher, DO    Family History Family History  Problem Relation Age of Onset   Depression Mother    Anxiety disorder Mother    Hypertension Mother    Hyperlipidemia Mother    Heart disease Mother    Obesity Mother    Depression Father     Social History Social History   Tobacco Use   Smoking status: Never   Smokeless tobacco: Never  Substance Use Topics   Alcohol use: No   Drug use: No      Allergies   Patient has no known allergies.   Review of Systems Review of Systems  Constitutional:  Positive for chills and fever. Negative for activity change, appetite change, diaphoresis, fatigue and unexpected weight change.  HENT:  Positive for congestion, ear pain, postnasal drip and rhinorrhea. Negative for dental problem, drooling, ear discharge, facial swelling, hearing loss, mouth sores, nosebleeds, sinus pressure, sinus pain, sneezing, sore throat, tinnitus, trouble swallowing and voice change.   Respiratory:  Positive for cough and wheezing. Negative for apnea, choking, chest tightness, shortness of breath and stridor.   Cardiovascular: Negative.   Gastrointestinal: Negative.   Musculoskeletal:  Positive for myalgias. Negative for arthralgias, back pain, gait problem, joint swelling, neck pain and neck stiffness.  Skin: Negative.   Neurological: Negative.      Physical Exam Triage Vital Signs ED Triage Vitals [04/05/22 1143]  Enc Vitals Group     BP 117/80     Pulse Rate 76     Resp 17     Temp 98 F (36.7 C)     Temp Source Oral     SpO2 95 %     Weight      Height      Head Circumference      Peak Flow      Pain Score 4     Pain Loc      Pain Edu?      Excl. in [place]?    No data found.  Updated Vital Signs BP 117/80 (BP Location: Left Arm)   Pulse 76   Temp 98 F (36.7 C) (Oral)   Resp 17   SpO2 95%   Visual Acuity Right Eye Distance:   Left Eye Distance:   Bilateral Distance:    Right Eye Near:   Left Eye Near:    Bilateral Near:     Physical Exam Constitutional:      Appearance: Normal appearance.  HENT:     Right Ear: Hearing, ear canal and external ear normal. A middle ear effusion is present.     Left Ear: Hearing, ear canal and external ear normal. A middle ear effusion is present.     Nose: Congestion and rhinorrhea present.     Mouth/Throat:     Mouth: Mucous membranes are moist.     Pharynx: No posterior oropharyngeal  erythema.  Eyes:     Extraocular Movements: Extraocular movements intact.  Cardiovascular:     Rate and Rhythm: Normal rate and regular rhythm.     Pulses: Normal pulses.     Heart sounds: Normal heart sounds.  Pulmonary:     Effort: Pulmonary effort is normal.     Breath sounds: Normal breath sounds.  Neurological:     Mental Status: She is alert.      UC Treatments / Results  Labs (all labs ordered are listed, but only abnormal results are displayed) Labs Reviewed - No data to display  EKG   Radiology No results found.  Procedures Procedures (including critical care time)  Medications Ordered in UC Medications - No data to display  Initial Impression / Assessment and Plan / UC Course  I have reviewed the triage vital signs and the nursing notes.  Pertinent labs & imaging results that were available during my care of the patient were reviewed by me and considered in my medical decision making (see chart for details). \ Acute URI  Patient is in no signs of distress nor toxic appearing.  Vital signs are stable.  Low suspicion for pneumonia, pneumothorax or bronchitis and therefore will defer imaging.  Viral testing completed 4 days prior, negative.  Will prescribe antibiotic if symptoms are progressing without signs of resolution, as azithromycin prescribed as well as prednisone for management of wheezing, Promethazine DM prescribed for coughing at nighttime recommended currently continue use of additional prescribed medications for support.May use additional over-the-counter medications as needed for supportive care.  May follow-up with urgent care as needed if symptoms persist or worsen.  Note given.   Final Clinical Impressions(s) / UC Diagnoses   Final diagnoses:  None   Discharge Instructions   None    ED Prescriptions   None    PDMP not reviewed this encounter.   Hans Eden, NP 04/05/22 1214

## 2022-04-08 ENCOUNTER — Other Ambulatory Visit (HOSPITAL_COMMUNITY): Payer: Self-pay

## 2022-04-08 MED ORDER — WEGOVY 0.25 MG/0.5ML ~~LOC~~ SOAJ
0.2500 mg | SUBCUTANEOUS | 0 refills | Status: DC
  Filled 2022-04-08 – 2022-05-28 (×2): qty 2, 28d supply, fill #0

## 2022-05-25 ENCOUNTER — Other Ambulatory Visit (HOSPITAL_COMMUNITY): Payer: Self-pay

## 2022-05-28 ENCOUNTER — Other Ambulatory Visit: Payer: Self-pay

## 2022-05-29 ENCOUNTER — Ambulatory Visit: Payer: 59 | Admitting: Physician Assistant

## 2022-06-01 ENCOUNTER — Other Ambulatory Visit: Payer: Self-pay

## 2022-06-03 ENCOUNTER — Other Ambulatory Visit: Payer: Self-pay

## 2022-06-09 ENCOUNTER — Other Ambulatory Visit: Payer: Self-pay

## 2022-06-19 ENCOUNTER — Other Ambulatory Visit: Payer: Self-pay

## 2022-06-23 ENCOUNTER — Ambulatory Visit: Payer: 59 | Admitting: Physician Assistant

## 2022-06-23 ENCOUNTER — Encounter: Payer: Self-pay | Admitting: Physician Assistant

## 2022-06-23 DIAGNOSIS — M25562 Pain in left knee: Secondary | ICD-10-CM

## 2022-06-23 DIAGNOSIS — G894 Chronic pain syndrome: Secondary | ICD-10-CM | POA: Diagnosis not present

## 2022-06-23 DIAGNOSIS — M79671 Pain in right foot: Secondary | ICD-10-CM

## 2022-06-23 DIAGNOSIS — M79672 Pain in left foot: Secondary | ICD-10-CM

## 2022-06-23 DIAGNOSIS — M2550 Pain in unspecified joint: Secondary | ICD-10-CM | POA: Diagnosis not present

## 2022-06-23 DIAGNOSIS — M25561 Pain in right knee: Secondary | ICD-10-CM | POA: Diagnosis not present

## 2022-06-23 DIAGNOSIS — G8929 Other chronic pain: Secondary | ICD-10-CM

## 2022-06-23 NOTE — Progress Notes (Signed)
Office Visit Note   Patient: Kimberly Wells           Date of Birth: 1976/02/03           MRN: [id number] Visit Date: 06/23/2022              Requested by: Maryagnes Amos, FNP [address] [place],  [place] [address] PCP: Maryagnes Amos, FNP  Chief Complaint  Patient presents with   Right Knee - Pain   Left Knee - Pain      HPI: Kimberly Wells is a pleasant 47 year old woman who I seen before for bilateral knee pain.  She has had steroid and gel injections.  She said they helped some.  She also takes anti-inflammatory.  She has recently undergone procedure to help with her lymphedema.  She comes in today because she has polyarthralgia.  She is concerned because she says there are days that she does well and other days when she just feels like she cannot move because she is stiff.  Pain in her knees right now is mild.  She is concerned now because she gets pain in all of her joints.  These are helped by heat.  Her mother was recently hospitalized and there was concern that she had an inflammatory condition.  She has been referred to a rheumatologist.  She said she takes after her mother with regards to her health.  Assessment & Plan: Visit Diagnoses:  1. Chronic pain syndrome   2. Bilateral foot pain   3. Chronic pain of both knees   4. Polyarthralgia     Plan: Patient is most concerned about her polyarthralgia today.  Also can manifest itself with stiffness and fatigue in multiple joints.  She has tried all sorts of conservative treatment including conditioning and low impact aerobic exercise.  She has tried oral anti-inflammatories.  She is even tried eliminating certain things in her diet to see if this is the cause.  None of this has given her an answer to her and inflammatory symptoms.  She has not been worked up for an inflammatory condition.  I would like to draw an inflammatory panel today.  Will discuss this with her in a couple days but would not hesitate  to refer her to a rheumatologist  Follow-Up Instructions: Will call with blood work  Ortho Exam  Patient is alert, oriented, no adenopathy, well-dressed, normal affect, normal respiratory effort. Examination she is sitting comfortably in a chair today.  She her knees she has no redness no erythema she does have some crepitus with range of motion of her knees.  She is neurovascular intact.  Imaging: No results found. No images are attached to the encounter.  Labs: Lab Results  Component Value Date   HGBA1C 5.5 08/31/2019   REPTSTATUS 04/04/2022 FINAL 04/01/2022   CULT  04/01/2022    NO GROUP A STREP (S.PYOGENES) ISOLATED Performed at [facility], [address], [place], [place] [address]      Lab Results  Component Value Date   ALBUMIN 3.5 06/01/2021   ALBUMIN 3.4 (L) 10/06/2019   ALBUMIN 3.9 08/31/2019    No results found for: "MG" Lab Results  Component Value Date   VD25OH 16.8 (L) 08/31/2019    No results found for: "PREALBUMIN"    Latest Ref Rng & Units 06/01/2021   11:48 PM 10/06/2019    5:53 PM 10/02/2019   10:48 PM  CBC EXTENDED  WBC 4.0 -  10.5 K/uL 8.0  11.9  8.8   RBC 3.87 - 5.11 MIL/uL 4.96  4.52  5.03   Hemoglobin 12.0 - 15.0 g/dL 16.1  09.6  04.5   HCT 36.0 - 46.0 % 40.7  38.0  42.2   Platelets 150 - 400 K/uL 364  382  406   NEUT# 1.7 - 7.7 K/uL 4.8  9.6    Lymph# 0.7 - 4.0 K/uL 2.2  1.4       There is no height or weight on file to calculate BMI.  Orders:  Orders Placed This Encounter  Procedures   CBC w/Diff/Platelet   Sed Rate (ESR)   Rheumatoid Factor   Antinuclear Antib (ANA)   Uric acid   No orders of the defined types were placed in this encounter.    Procedures: No procedures performed  Clinical Data: No additional findings.  ROS:  All other systems negative, except as noted in the HPI. Review of Systems  Objective: Vital Signs: There were no vitals taken for this visit.  Specialty Comments:  No specialty  comments available.  PMFS History: Patient Active Problem List   Diagnosis Date Noted   Depression 12/19/2012   Past Medical History:  Diagnosis Date   ADHD    Anemia    Anxiety    Asthma    B12 deficiency    Depression    Depression    Eczema    GERD (gastroesophageal reflux disease)    Joint pain    Kidney infection    Lactose intolerance    Lymphedema    Multiple food allergies    Sleep apnea    Vitamin D deficiency     Family History  Problem Relation Age of Onset   Depression Mother    Anxiety disorder Mother    Hypertension Mother    Hyperlipidemia Mother    Heart disease Mother    Obesity Mother    Depression Father     Past Surgical History:  Procedure Laterality Date   TONSILLECTOMY     Social History   Occupational History   Occupation: Buyer, retail  Tobacco Use   Smoking status: Never   Smokeless tobacco: Never  Substance and Sexual Activity   Alcohol use: No   Drug use: No   Sexual activity: Never    Birth control/protection: I.U.D.

## 2022-07-07 ENCOUNTER — Telehealth: Payer: Self-pay

## 2022-07-07 NOTE — Telephone Encounter (Signed)
The patient came in today to have labs drawn (ordered on 06/23/22 by [place] Persons, PA-C). As at that ov, the patient is quite dehydrated -- not been drinking or eating much due to nausea from a new medication. Was not able to collect enough blood for all the tests, so sending what was collected today for CBC w/diff & RF. The patient will return at a later date to collect more blood for the ESR, ANA & uric acid.

## 2022-07-09 LAB — CBC WITH DIFFERENTIAL/PLATELET
Absolute Monocytes: 567 cells/uL (ref 200–950)
Basophils Absolute: 32 cells/uL (ref 0–200)
Basophils Relative: 0.5 %
Eosinophils Absolute: 101 cells/uL (ref 15–500)
Eosinophils Relative: 1.6 %
HCT: 42.4 % (ref 35.0–45.0)
Hemoglobin: 12.8 g/dL (ref 11.7–15.5)
Lymphs Abs: 1733 cells/uL (ref 850–3900)
MCH: 26 pg — ABNORMAL LOW (ref 27.0–33.0)
MCHC: 30.2 g/dL — ABNORMAL LOW (ref 32.0–36.0)
MCV: 86.2 fL (ref 80.0–100.0)
MPV: 11.6 fL (ref 7.5–12.5)
Monocytes Relative: 9 %
Neutro Abs: 3868 cells/uL (ref 1500–7800)
Neutrophils Relative %: 61.4 %
Platelets: 308 10*3/uL (ref 140–400)
RBC: 4.92 10*6/uL (ref 3.80–5.10)
RDW: 13.3 % (ref 11.0–15.0)
Total Lymphocyte: 27.5 %
WBC: 6.3 10*3/uL (ref 3.8–10.8)

## 2022-07-09 LAB — RHEUMATOID FACTOR: Rheumatoid fact SerPl-aCnc: <10 IU/mL (ref <14)

## 2022-11-25 ENCOUNTER — Ambulatory Visit: Payer: 59 | Admitting: Physician Assistant

## 2022-12-22 ENCOUNTER — Ambulatory Visit: Payer: 59 | Admitting: Physician Assistant

## 2023-01-10 ENCOUNTER — Other Ambulatory Visit: Payer: Self-pay

## 2023-01-10 ENCOUNTER — Emergency Department (HOSPITAL_BASED_OUTPATIENT_CLINIC_OR_DEPARTMENT_OTHER)
Admission: EM | Admit: 2023-01-10 | Discharge: 2023-01-10 | Discharge status: Against Medical Advice | Payer: 59 | Source: Home / Self Care

## 2023-04-01 ENCOUNTER — Ambulatory Visit: Payer: 59 | Admitting: Allergy & Immunology

## 2023-04-05 ENCOUNTER — Encounter (HOSPITAL_COMMUNITY): Payer: Self-pay

## 2023-04-05 ENCOUNTER — Emergency Department (HOSPITAL_COMMUNITY): Payer: 59

## 2023-04-05 ENCOUNTER — Other Ambulatory Visit: Payer: Self-pay

## 2023-04-05 ENCOUNTER — Observation Stay (HOSPITAL_COMMUNITY)
Admission: EM | Admit: 2023-04-05 | Discharge: 2023-04-07 | Disposition: A | Discharge status: Home / Self Care | Payer: 59 | Attending: Internal Medicine | Admitting: Internal Medicine

## 2023-04-05 DIAGNOSIS — E876 Hypokalemia: Secondary | ICD-10-CM | POA: Diagnosis not present

## 2023-04-05 DIAGNOSIS — Z7984 Long term (current) use of oral hypoglycemic drugs: Secondary | ICD-10-CM | POA: Insufficient documentation

## 2023-04-05 DIAGNOSIS — Z79899 Other long term (current) drug therapy: Secondary | ICD-10-CM | POA: Diagnosis not present

## 2023-04-05 DIAGNOSIS — R1013 Epigastric pain: Secondary | ICD-10-CM | POA: Insufficient documentation

## 2023-04-05 DIAGNOSIS — F32A Depression, unspecified: Secondary | ICD-10-CM | POA: Diagnosis not present

## 2023-04-05 DIAGNOSIS — Z9189 Other specified personal risk factors, not elsewhere classified: Principal | ICD-10-CM

## 2023-04-05 DIAGNOSIS — R638 Other symptoms and signs concerning food and fluid intake: Secondary | ICD-10-CM | POA: Insufficient documentation

## 2023-04-05 DIAGNOSIS — R112 Nausea with vomiting, unspecified: Principal | ICD-10-CM | POA: Insufficient documentation

## 2023-04-05 DIAGNOSIS — J45909 Unspecified asthma, uncomplicated: Secondary | ICD-10-CM | POA: Insufficient documentation

## 2023-04-05 LAB — COMPREHENSIVE METABOLIC PANEL
ALT: 13 U/L (ref 0–44)
AST: 16 U/L (ref 15–41)
Albumin: 3.8 g/dL (ref 3.5–5.0)
Alkaline Phosphatase: 75 U/L (ref 38–126)
Anion gap: 11 (ref 5–15)
BUN: 8 mg/dL (ref 6–20)
CO2: 19 mmol/L — ABNORMAL LOW (ref 22–32)
Calcium: 9.1 mg/dL (ref 8.9–10.3)
Chloride: 106 mmol/L (ref 98–111)
Creatinine, Ser: 0.76 mg/dL (ref 0.44–1.00)
GFR, Estimated: >60 mL/min (ref >60)
Glucose, Bld: 78 mg/dL (ref 70–99)
Potassium: 3.6 mmol/L (ref 3.5–5.1)
Sodium: 136 mmol/L (ref 135–145)
Total Bilirubin: 0.5 mg/dL (ref 0.0–1.2)
Total Protein: 8 g/dL (ref 6.5–8.1)

## 2023-04-05 LAB — CBC WITH DIFFERENTIAL/PLATELET
Abs Immature Granulocytes: 0.02 10*3/uL (ref 0.00–0.07)
Basophils Absolute: 0 10*3/uL (ref 0.0–0.1)
Basophils Relative: 1 %
Eosinophils Absolute: 0.1 10*3/uL (ref 0.0–0.5)
Eosinophils Relative: 2 %
HCT: 47 % — ABNORMAL HIGH (ref 36.0–46.0)
Hemoglobin: 14.3 g/dL (ref 12.0–15.0)
Immature Granulocytes: 0 %
Lymphocytes Relative: 24 %
Lymphs Abs: 1.5 10*3/uL (ref 0.7–4.0)
MCH: 26.4 pg (ref 26.0–34.0)
MCHC: 30.4 g/dL (ref 30.0–36.0)
MCV: 86.9 fL (ref 80.0–100.0)
Monocytes Absolute: 0.5 10*3/uL (ref 0.1–1.0)
Monocytes Relative: 7 %
Neutro Abs: 4.3 10*3/uL (ref 1.7–7.7)
Neutrophils Relative %: 66 %
Platelets: 411 10*3/uL — ABNORMAL HIGH (ref 150–400)
RBC: 5.41 MIL/uL — ABNORMAL HIGH (ref 3.87–5.11)
RDW: 14.3 % (ref 11.5–15.5)
WBC: 6.4 10*3/uL (ref 4.0–10.5)
nRBC: 0 % (ref 0.0–0.2)

## 2023-04-05 LAB — URINALYSIS, W/ REFLEX TO CULTURE (INFECTION SUSPECTED)
Bilirubin Urine: NEGATIVE
Glucose, UA: NEGATIVE mg/dL
Ketones, ur: 5 mg/dL — AB
Leukocytes,Ua: NEGATIVE
Nitrite: NEGATIVE
Protein, ur: NEGATIVE mg/dL
Specific Gravity, Urine: 1.014 (ref 1.005–1.030)
pH: 7 (ref 5.0–8.0)

## 2023-04-05 LAB — HCG, SERUM, QUALITATIVE: Preg, Serum: NEGATIVE

## 2023-04-05 LAB — LIPASE, BLOOD: Lipase: 28 U/L (ref 11–51)

## 2023-04-05 MED ORDER — ORAL CARE MOUTH RINSE: 15.0000 mL | OROMUCOSAL | Status: DC | PRN

## 2023-04-05 MED ORDER — MELATONIN 3 MG PO TABS
3.0000 mg | ORAL_TABLET | Freq: Every day | ORAL | Status: DC
  Administered 2023-04-05 – 2023-04-06 (×2): 3 mg via ORAL
  Filled 2023-04-05 (×2): qty 1

## 2023-04-05 MED ORDER — DICYCLOMINE HCL 10 MG/ML IM SOLN
20.0000 mg | Freq: Once | INTRAMUSCULAR | Status: AC
  Administered 2023-04-05: 20 mg via INTRAMUSCULAR
  Filled 2023-04-05: qty 2

## 2023-04-05 MED ORDER — IOHEXOL 300 MG/ML  SOLN
100.0000 mL | Freq: Once | INTRAMUSCULAR | Status: AC | PRN
  Administered 2023-04-05: 100 mL via INTRAVENOUS

## 2023-04-05 MED ORDER — LACTATED RINGERS IV BOLUS
1000.0000 mL | Freq: Once | INTRAVENOUS | Status: AC
  Administered 2023-04-05: 1000 mL via INTRAVENOUS

## 2023-04-05 MED ORDER — ACETAMINOPHEN 325 MG PO TABS: 650.0000 mg | ORAL_TABLET | Freq: Four times a day (QID) | ORAL | Status: DC | PRN

## 2023-04-05 MED ORDER — FAMOTIDINE 20 MG PO TABS
40.0000 mg | ORAL_TABLET | Freq: Every day | ORAL | Status: DC
  Administered 2023-04-05: 40 mg via ORAL
  Filled 2023-04-05 (×2): qty 2

## 2023-04-05 MED ORDER — ONDANSETRON HCL 4 MG/2ML IJ SOLN
4.0000 mg | Freq: Once | INTRAMUSCULAR | Status: AC
  Administered 2023-04-05: 4 mg via INTRAVENOUS
  Filled 2023-04-05: qty 2

## 2023-04-05 MED ORDER — FLUOXETINE HCL 10 MG PO CAPS
10.0000 mg | ORAL_CAPSULE | Freq: Every day | ORAL | Status: DC
  Administered 2023-04-06 – 2023-04-07 (×2): 10 mg via ORAL
  Filled 2023-04-05 (×2): qty 1

## 2023-04-05 MED ORDER — PROMETHAZINE HCL 25 MG PO TABS
12.5000 mg | ORAL_TABLET | Freq: Four times a day (QID) | ORAL | Status: DC | PRN
  Administered 2023-04-06 – 2023-04-07 (×2): 12.5 mg via ORAL
  Filled 2023-04-05 (×2): qty 1

## 2023-04-05 MED ORDER — ACETAMINOPHEN 650 MG RE SUPP: 650.0000 mg | Freq: Four times a day (QID) | RECTAL | Status: DC | PRN

## 2023-04-05 MED ORDER — METOCLOPRAMIDE HCL 5 MG/ML IJ SOLN
10.0000 mg | Freq: Once | INTRAMUSCULAR | Status: AC
  Administered 2023-04-05: 10 mg via INTRAVENOUS
  Filled 2023-04-05: qty 2

## 2023-04-05 MED ORDER — FERROUS SULFATE 325 (65 FE) MG PO TABS
325.0000 mg | ORAL_TABLET | Freq: Every day | ORAL | Status: DC
  Administered 2023-04-06: 325 mg via ORAL
  Filled 2023-04-05: qty 1

## 2023-04-05 MED ORDER — PANTOPRAZOLE SODIUM 40 MG PO TBEC
40.0000 mg | DELAYED_RELEASE_TABLET | Freq: Two times a day (BID) | ORAL | Status: DC
  Administered 2023-04-06 – 2023-04-07 (×3): 40 mg via ORAL
  Filled 2023-04-05 (×3): qty 1

## 2023-04-05 MED ORDER — ENOXAPARIN SODIUM 60 MG/0.6ML IJ SOSY
55.0000 mg | PREFILLED_SYRINGE | Freq: Every day | INTRAMUSCULAR | Status: DC
  Administered 2023-04-05: 55 mg via SUBCUTANEOUS
  Filled 2023-04-05 (×2): qty 0.6

## 2023-04-05 MED ORDER — PANTOPRAZOLE SODIUM 40 MG PO TBEC
40.0000 mg | DELAYED_RELEASE_TABLET | Freq: Every day | ORAL | Status: DC
  Administered 2023-04-05: 40 mg via ORAL
  Filled 2023-04-05: qty 1

## 2023-04-05 NOTE — ED Notes (Signed)
ED TO INPATIENT HANDOFF REPORT  Name/Age/Gender Kimberly Wells 48 y.o. female  Code Status    Code Status Orders  (From admission, onward)           Start     Ordered   04/05/23 1938  Full code  Continuous       Question:  By:  Answer:  Consent: discussion documented in EHR   04/05/23 1938           Code Status History     This patient has a current code status but no historical code status.       Home/SNF/Other Home  Chief Complaint Intractable nausea and vomiting [R11.2]  Level of Care/Admitting Diagnosis ED Disposition     ED Disposition  Admit   Condition  --   Comment  Hospital Area: [facility] [[id number]]  Level of Care: Med-Surg [16]  May place patient in observation at [facility] or [facility] if equivalent level of care is available:: No  Covid Evaluation: Asymptomatic - no recent exposure (last 10 days) testing not required  Diagnosis: Intractable nausea and vomiting [720114]  Admitting Physician: Alan Mulder [[id number]]  Attending Physician: Alan Mulder [[id number]]          Medical History Past Medical History:  Diagnosis Date   ADHD    Anemia    Anxiety    Asthma    B12 deficiency    Depression    Depression    Eczema    GERD (gastroesophageal reflux disease)    Joint pain    Kidney infection    Lactose intolerance    Lymphedema    Multiple food allergies    Sleep apnea    Vitamin D deficiency     Allergies No Known Allergies  IV Location/Drains/Wounds Patient Lines/Drains/Airways Status     Active Line/Drains/Airways     Name Placement date Placement time Site Days   Peripheral IV 04/05/23 24 G Right;Posterior Hand 04/05/23  --  Hand  less than 1   Peripheral IV 04/05/23 20 G 2.5" Left Forearm 04/05/23  1146  Forearm  less than 1            Labs/Imaging Results for orders placed or performed during the hospital encounter of 04/05/23 (from the past 48 hours)  CBC with Differential      Status: Abnormal   Collection Time: 04/05/23 11:39 AM  Result Value Ref Range   WBC 6.4 4.0 - 10.5 K/uL   RBC 5.41 (H) 3.87 - 5.11 MIL/uL   Hemoglobin 14.3 12.0 - 15.0 g/dL   HCT 19.1 (H) 47.8 - 29.5 %   MCV 86.9 80.0 - 100.0 fL   MCH 26.4 26.0 - 34.0 pg   MCHC 30.4 30.0 - 36.0 g/dL   RDW 62.1 30.8 - 65.7 %   Platelets 411 (H) 150 - 400 K/uL   nRBC 0.0 0.0 - 0.2 %   Neutrophils Relative % 66 %   Neutro Abs 4.3 1.7 - 7.7 K/uL   Lymphocytes Relative 24 %   Lymphs Abs 1.5 0.7 - 4.0 K/uL   Monocytes Relative 7 %   Monocytes Absolute 0.5 0.1 - 1.0 K/uL   Eosinophils Relative 2 %   Eosinophils Absolute 0.1 0.0 - 0.5 K/uL   Basophils Relative 1 %   Basophils Absolute 0.0 0.0 - 0.1 K/uL   Immature Granulocytes 0 %   Abs Immature Granulocytes 0.02 0.00 - 0.07 K/uL    Comment: Performed at  [facility], [address], [place], [place] [address]  Comprehensive metabolic panel     Status: Abnormal   Collection Time: 04/05/23 11:39 AM  Result Value Ref Range   Sodium 136 135 - 145 mmol/L   Potassium 3.6 3.5 - 5.1 mmol/L   Chloride 106 98 - 111 mmol/L   CO2 19 (L) 22 - 32 mmol/L   Glucose, Bld 78 70 - 99 mg/dL    Comment: Glucose reference range applies only to samples taken after fasting for at least 8 hours.   BUN 8 6 - 20 mg/dL   Creatinine, Ser 9.81 0.44 - 1.00 mg/dL   Calcium 9.1 8.9 - 19.1 mg/dL   Total Protein 8.0 6.5 - 8.1 g/dL   Albumin 3.8 3.5 - 5.0 g/dL   AST 16 15 - 41 U/L   ALT 13 0 - 44 U/L   Alkaline Phosphatase 75 38 - 126 U/L   Total Bilirubin 0.5 0.0 - 1.2 mg/dL   GFR, Estimated >47 >82 mL/min    Comment: (NOTE) Calculated using the CKD-EPI Creatinine Equation (2021)    Anion gap 11 5 - 15    Comment: Performed at [facility], [address], [place], [place] [address]  Lipase, blood     Status: None   Collection Time: 04/05/23 11:39 AM  Result Value Ref Range   Lipase 28 11 - 51 U/L    Comment: Performed at [facility], [address], [place], [place] [address]  hCG, serum, qualitative     Status: None   Collection Time: 04/05/23 11:39 AM  Result Value Ref Range   Preg, Serum NEGATIVE NEGATIVE    Comment:        THE SENSITIVITY OF THIS METHODOLOGY IS >10 mIU/mL. Performed at [facility], [address], [place], [place] [address]   Urinalysis, w/ Reflex to Culture (Infection Suspected) -Urine, Clean Catch     Status: Abnormal   Collection Time: 04/05/23  1:33 PM  Result Value Ref Range   Specimen Source URINE, CLEAN CATCH    Color, Urine YELLOW YELLOW   APPearance HAZY (A) CLEAR   Specific Gravity, Urine 1.014 1.005 - 1.030   pH 7.0 5.0 - 8.0   Glucose, UA NEGATIVE NEGATIVE mg/dL   Hgb urine dipstick SMALL (A) NEGATIVE   Bilirubin Urine NEGATIVE NEGATIVE   Ketones, ur 5 (A) NEGATIVE mg/dL   Protein, ur NEGATIVE NEGATIVE mg/dL   Nitrite NEGATIVE NEGATIVE   Leukocytes,Ua NEGATIVE NEGATIVE   RBC / HPF 0-5 0 - 5 RBC/hpf   WBC, UA 0-5 0 - 5 WBC/hpf    Comment:        Reflex urine culture not performed if WBC <=10, OR if Squamous epithelial cells >5. If Squamous epithelial cells >5 suggest recollection.    Bacteria, UA RARE (A) NONE SEEN   Squamous Epithelial / HPF 11-20 0 - 5 /HPF   Mucus PRESENT     Comment: Performed at [facility], [address], [place], [place] [address]   CT ABDOMEN PELVIS W CONTRAST Result Date: 04/05/2023 CLINICAL DATA:  Abdominal pain, acute, nonlocalized. EXAM: CT ABDOMEN AND PELVIS WITH CONTRAST TECHNIQUE: Multidetector CT imaging of the abdomen and pelvis was performed using the standard protocol following bolus administration of intravenous contrast. RADIATION DOSE REDUCTION: This exam was performed according to the departmental dose-optimization program which includes automated exposure control, adjustment of the mA and/or kV according to patient  size and/or use of iterative reconstruction technique.  CONTRAST:  OMNIPAQUE IOHEXOL 300 MG/ML  SOLN COMPARISON:  None Available. FINDINGS: Lower chest: The lung bases are clear. No pleural effusion. The heart is normal in size. No pericardial effusion. Hepatobiliary: The liver is normal in size. Non-cirrhotic configuration. No suspicious mass. These is mild diffuse hepatic steatosis. No intrahepatic or extrahepatic bile duct dilation. No calcified gallstones. Normal gallbladder wall thickness. No pericholecystic inflammatory changes. Pancreas: Unremarkable. No pancreatic ductal dilatation or surrounding inflammatory changes. Spleen: Within normal limits. No focal lesion. Adrenals/Urinary Tract: Adrenal glands are unremarkable. No suspicious renal mass. There is a 8 x 11 mm simple cyst in the right kidney upper pole laterally. No nephroureterolithiasis or obstructive uropathy. Unremarkable urinary bladder. Stomach/Bowel: There is a small sliding hiatal hernia. No disproportionate dilation of the small or large bowel loops. No evidence of abnormal bowel wall thickening or inflammatory changes. The appendix is unremarkable. There are multiple diverticula mainly in the left hemi colon, without imaging signs of diverticulitis. Vascular/Lymphatic: No ascites or pneumoperitoneum. No abdominal or pelvic lymphadenopathy, by size criteria. No aneurysmal dilation of the major abdominal arteries. Reproductive: Evaluation of reproductive organs is limited on the CT scan exam. However, having said that T-shaped intrauterine device noted, which appears in satisfactory position. Uterus otherwise appears unremarkable. There is a well circumscribed 2.8 x 3.3 cm structure in the left adnexa most likely ovarian in etiology. It has internal CT attenuation of 25-26 Hounsfield units. This is favored to represent mildly complex ovarian cyst. Correlate clinically to determine the need for further characterisation of the structure with pelvic ultrasound. Unremarkable right ovary. Other:  The visualized soft tissues and abdominal wall are unremarkable. Musculoskeletal: No suspicious osseous lesions. IMPRESSION: *No acute inflammatory process identified within the abdomen or pelvis. *Multiple other nonacute observations, as described above. Electronically Signed   By: Jules Schick M.D.   On: 04/05/2023 16:45    Pending Labs Unresulted Labs (From admission, onward)    None       Vitals/Pain Today's Vitals   04/05/23 1018 04/05/23 1322 04/05/23 1636 04/05/23 1738  BP: (!) 142/97 (!) 153/87 (!) 135/104   Pulse: (!) 102 70 65   Resp: 17 18 16    Temp: 97.9 F (36.6 C) 97.8 F (36.6 C)  98.6 F (37 C)  TempSrc:  Oral    SpO2: 100% 100% 100%   Weight: 111 kg     Height: 4\' 11"  (1.499 m)     PainSc:        Isolation Precautions No active isolations  Medications Medications  pantoprazole (PROTONIX) EC tablet 40 mg (40 mg Oral Given 04/05/23 2024)  ferrous sulfate tablet 325 mg (has no administration in time range)  enoxaparin (LOVENOX) injection 55 mg (has no administration in time range)  acetaminophen (TYLENOL) tablet 650 mg (has no administration in time range)    Or  acetaminophen (TYLENOL) suppository 650 mg (has no administration in time range)  promethazine (PHENERGAN) tablet 12.5 mg (has no administration in time range)  lactated ringers bolus 1,000 mL (0 mLs Intravenous Stopped 04/05/23 1405)  ondansetron (ZOFRAN) injection 4 mg (4 mg Intravenous Given 04/05/23 1134)  dicyclomine (BENTYL) injection 20 mg (20 mg Intramuscular Given 04/05/23 1134)  iohexol (OMNIPAQUE) 300 MG/ML solution 100 mL (100 mLs Intravenous Contrast Given 04/05/23 1451)  ondansetron (ZOFRAN) injection 4 mg (4 mg Intravenous Given 04/05/23 1738)  metoCLOPramide (REGLAN) injection 10 mg (10 mg Intravenous Given 04/05/23 1915)    Mobility walks

## 2023-04-05 NOTE — ED Provider Notes (Signed)
[place] EMERGENCY DEPARTMENT AT [facility] Provider Note   CSN: [id number] Arrival date & time: 04/05/23  1008     History  No chief complaint on file.   Kimberly Wells is a 48 y.o. female.  HPI     Increase in zepbound from 2.5 to 5 2 weeks ago Tuesday began to have increasing nausea, vomiting 3-4 times per day, abdominal pain epigastric and throughout. Belching frequently nad feels needs to pass flatus but cannot. No BM since Friday.  No known fevers. Does have hot flashes. No urinary symptoms but has not been able to urinate since yesterday.  Only able to keep down bone broth, only thing to keep down. Feels better curling up in fetal position.    Nothing staying down, not nausea medicine.   No ibuprofen, no etoh, drugs No other changes in medication Scheduled to have endoscopy and colonoscopy with [facility] next week Said liver numbers low ? Recommended coming   Past Medical History:  Diagnosis Date   ADHD    Anemia    Anxiety    Asthma    B12 deficiency    Depression    Depression    Eczema    GERD (gastroesophageal reflux disease)    Joint pain    Kidney infection    Lactose intolerance    Lymphedema    Multiple food allergies    Sleep apnea    Vitamin D deficiency      Home Medications Prior to Admission medications   Medication Sig Start Date End Date Taking? Authorizing Provider  albuterol (VENTOLIN HFA) 108 (90 Base) MCG/ACT inhaler Inhale 1-2 puffs into the lungs every 6 (six) hours as needed for wheezing or shortness of breath. 04/01/22   Gustavus Bryant, FNP  azithromycin (ZITHROMAX) 250 MG tablet Take 1 tablet (250 mg total) by mouth daily. Take first 2 tablets together, then 1 every day until finished. 04/05/22   White, Elita Boone, NP  benzonatate (TESSALON) 100 MG capsule Take 1 capsule (100 mg total) by mouth every 8 (eight) hours as needed for cough. 04/01/22   Gustavus Bryant, FNP  budesonide-formoterol [facility]) 160-4.5 MCG/ACT  inhaler Inhale 2 puffs into the lungs 2 (two) times daily. 10/06/19   Marcelyn Bruins, MD  dexmethylphenidate (FOCALIN XR) 10 MG 24 hr capsule Take 20 mg by mouth daily. 06/19/19   [provider]  esomeprazole (NEXIUM) 40 MG capsule TAKE 1 CAPSULE (40 MG TOTAL) BY MOUTH DAILY AT 12 NOON. 10/02/19   Helane Rima, DO  ferrous sulfate 325 (65 FE) MG EC tablet Take by mouth. 10/27/18   [provider]  fluticasone (FLONASE) 50 MCG/ACT nasal spray Place 1 spray into both nostrils daily. 04/01/22   Gustavus Bryant, FNP  furosemide (LASIX) 20 MG tablet Take 10 mg by mouth daily.  10/27/18   [provider]  metFORMIN (GLUCOPHAGE) 500 MG tablet TAKE 1 TABLET BY MOUTH EVERY DAY WITH BREAKFAST 10/02/19   Helane Rima, DO  omeprazole (PRILOSEC) 20 MG capsule Take 1 capsule (20 mg total) by mouth daily. 10/06/19 11/05/19  Graciella Freer A, PA-C  phenol (CHLORASEPTIC) 1.4 % LIQD Use as directed 1 spray in the mouth or throat as needed for throat irritation / pain. 04/01/22   Gustavus Bryant, FNP  predniSONE (DELTASONE) 20 MG tablet Take 2 tablets (40 mg total) by mouth daily. 04/05/22   Valinda Hoar, NP  promethazine-dextromethorphan (PROMETHAZINE-DM) 6.25-15 MG/5ML syrup Take 5 mLs by  mouth at bedtime as needed for cough. 04/05/22   Valinda Hoar, NP  Semaglutide-Weight Management (WEGOVY) 0.25 MG/0.5ML SOAJ Inject 0.25 mg into the skin once a week. 04/08/22     sucralfate (CARAFATE) 1 g tablet Take 1 tablet (1 g total) by mouth 4 (four) times daily -  with meals and at bedtime for 21 days. 10/06/19 10/27/19  Maxwell Caul, PA-C  topiramate (TOPAMAX) 50 MG tablet TAKE 1 TABLET (50 MG TOTAL) BY MOUTH DAILY WITH SUPPER. 10/02/19   Helane Rima, DO      Allergies    Patient has no known allergies.    Review of Systems   Review of Systems  Physical Exam Updated Vital Signs BP (!) 142/88 (BP Location: Left Arm)   Pulse 81   Temp 98.1 F (36.7 C) (Oral)   Resp 18    Ht 4\' 11"  (1.499 m)   Wt 101.2 kg   SpO2 100%   BMI 45.06 kg/m  Physical Exam Vitals and nursing note reviewed.  Constitutional:      General: She is not in acute distress.    Appearance: She is well-developed. She is not diaphoretic.  HENT:     Head: Normocephalic and atraumatic.  Eyes:     Conjunctiva/sclera: Conjunctivae normal.  Cardiovascular:     Rate and Rhythm: Normal rate and regular rhythm.     Heart sounds: Normal heart sounds. No murmur heard.    No friction rub. No gallop.  Pulmonary:     Effort: Pulmonary effort is normal. No respiratory distress.     Breath sounds: Normal breath sounds. No wheezing or rales.  Abdominal:     General: There is no distension.     Palpations: Abdomen is soft.     Tenderness: There is abdominal tenderness. There is no guarding.  Musculoskeletal:        General: No tenderness.     Cervical back: Normal range of motion.  Skin:    General: Skin is warm and dry.     Findings: No erythema or rash.  Neurological:     Mental Status: She is alert and oriented to person, place, and time.     ED Results / Procedures / Treatments   Labs (all labs ordered are listed, but only abnormal results are displayed) Labs Reviewed  CBC WITH DIFFERENTIAL/PLATELET - Abnormal; Notable for the following components:      Result Value   RBC 5.41 (*)    HCT 47.0 (*)    Platelets 411 (*)    All other components within normal limits  COMPREHENSIVE METABOLIC PANEL - Abnormal; Notable for the following components:   CO2 19 (*)    All other components within normal limits  URINALYSIS, W/ REFLEX TO CULTURE (INFECTION SUSPECTED) - Abnormal; Notable for the following components:   APPearance HAZY (*)    Hgb urine dipstick SMALL (*)    Ketones, ur 5 (*)    Bacteria, UA RARE (*)    All other components within normal limits  LIPASE, BLOOD  HCG, SERUM, QUALITATIVE    EKG None  Radiology CT ABDOMEN PELVIS W CONTRAST Result Date: 04/05/2023 CLINICAL  DATA:  Abdominal pain, acute, nonlocalized. EXAM: CT ABDOMEN AND PELVIS WITH CONTRAST TECHNIQUE: Multidetector CT imaging of the abdomen and pelvis was performed using the standard protocol following bolus administration of intravenous contrast. RADIATION DOSE REDUCTION: This exam was performed according to the departmental dose-optimization program which includes automated exposure control, adjustment of the mA and/or  kV according to patient size and/or use of iterative reconstruction technique. CONTRAST:  OMNIPAQUE IOHEXOL 300 MG/ML  SOLN COMPARISON:  None Available. FINDINGS: Lower chest: The lung bases are clear. No pleural effusion. The heart is normal in size. No pericardial effusion. Hepatobiliary: The liver is normal in size. Non-cirrhotic configuration. No suspicious mass. These is mild diffuse hepatic steatosis. No intrahepatic or extrahepatic bile duct dilation. No calcified gallstones. Normal gallbladder wall thickness. No pericholecystic inflammatory changes. Pancreas: Unremarkable. No pancreatic ductal dilatation or surrounding inflammatory changes. Spleen: Within normal limits. No focal lesion. Adrenals/Urinary Tract: Adrenal glands are unremarkable. No suspicious renal mass. There is a 8 x 11 mm simple cyst in the right kidney upper pole laterally. No nephroureterolithiasis or obstructive uropathy. Unremarkable urinary bladder. Stomach/Bowel: There is a small sliding hiatal hernia. No disproportionate dilation of the small or large bowel loops. No evidence of abnormal bowel wall thickening or inflammatory changes. The appendix is unremarkable. There are multiple diverticula mainly in the left hemi colon, without imaging signs of diverticulitis. Vascular/Lymphatic: No ascites or pneumoperitoneum. No abdominal or pelvic lymphadenopathy, by size criteria. No aneurysmal dilation of the major abdominal arteries. Reproductive: Evaluation of reproductive organs is limited on the CT scan exam. However,  having said that T-shaped intrauterine device noted, which appears in satisfactory position. Uterus otherwise appears unremarkable. There is a well circumscribed 2.8 x 3.3 cm structure in the left adnexa most likely ovarian in etiology. It has internal CT attenuation of 25-26 Hounsfield units. This is favored to represent mildly complex ovarian cyst. Correlate clinically to determine the need for further characterisation of the structure with pelvic ultrasound. Unremarkable right ovary. Other: The visualized soft tissues and abdominal wall are unremarkable. Musculoskeletal: No suspicious osseous lesions. IMPRESSION: *No acute inflammatory process identified within the abdomen or pelvis. *Multiple other nonacute observations, as described above. Electronically Signed   By: Jules Schick M.D.   On: 04/05/2023 16:45    Procedures Procedures    Medications Ordered in ED Medications  ferrous sulfate tablet 325 mg (has no administration in time range)  enoxaparin (LOVENOX) injection 55 mg (55 mg Subcutaneous Given 04/05/23 2113)  acetaminophen (TYLENOL) tablet 650 mg (has no administration in time range)    Or  acetaminophen (TYLENOL) suppository 650 mg (has no administration in time range)  promethazine (PHENERGAN) tablet 12.5 mg (has no administration in time range)  Oral care mouth rinse (has no administration in time range)  pantoprazole (PROTONIX) EC tablet 40 mg (40 mg Oral Patient Refused/Not Given 04/05/23 2307)  famotidine (PEPCID) tablet 40 mg (40 mg Oral Given 04/05/23 2307)  FLUoxetine (PROZAC) capsule 10 mg (has no administration in time range)  melatonin tablet 3 mg (3 mg Oral Given 04/05/23 2324)  lactated ringers bolus 1,000 mL (0 mLs Intravenous Stopped 04/05/23 1405)  ondansetron (ZOFRAN) injection 4 mg (4 mg Intravenous Given 04/05/23 1134)  dicyclomine (BENTYL) injection 20 mg (20 mg Intramuscular Given 04/05/23 1134)  iohexol (OMNIPAQUE) 300 MG/ML solution 100 mL (100 mLs Intravenous  Contrast Given 04/05/23 1451)  ondansetron (ZOFRAN) injection 4 mg (4 mg Intravenous Given 04/05/23 1738)  metoCLOPramide (REGLAN) injection 10 mg (10 mg Intravenous Given 04/05/23 1915)    ED Course/ Medical Decision Making/ A&P Clinical Course as of 04/05/23 2351  Mon Apr 05, 2023  1656 CT ABDOMEN PELVIS W CONTRAST No acute findings. Diverticula without diverticulitis, sliding hiatal hernia, and left mildly complex ovarian cyst. [HN]  1732 Patient PO challenging. Keeping down liquids but having more nausea/pain  with crackers. Last received zofran 7 hours ago. Will redose 4 mg IV zofran and reassess.  [HN]  1909 After multiple rounds IV antiemetics patient still is unable to eat. She has severe pain/nausea in her abdomen with a bite of crackers. Patient will need to be admitted for inability to tolerate PO, likely d/t wegovy. Consulted to hospitalist. [HN]    Clinical Course User Index [HN] Loetta Rough, MD                                  [phone] female with history of GERD, depression, ADHD, recent increase in zepbound dosing presents with concern for nausea, vomiting, abdominal pain.   DDx includes appendicitis, pancreatitis, cholecystitis, pyelonephritis, nephrolithiasis, diverticulitis, ectopic pregnancy, colitis, SBO, medication side effect.   Labs completed and personally about interpreted by me show no leukocytosis, no anemia, no clinically significant electrolyte abnormalities, normal transaminases, no sign of pancreatitis, pregnancy test negative.  Feels improved with fluid, nausea medication, bentyl  CT abdomen pelvis pending at time of transfer of care.         Final Clinical Impression(s) / ED Diagnoses Final diagnoses:  At risk for inadequate oral intake  Epigastric pain  Nausea and vomiting, unspecified vomiting type    Rx / DC Orders ED Discharge Orders     None         Alvira Monday, MD 04/05/23 2351

## 2023-04-05 NOTE — ED Provider Notes (Signed)
3:27 PM Assumed care of patient from off-going team. For more details, please see note from same day.  In brief, this is a 48 y.o. female who presents with abdominal pain, N/V, inability to tolerate PO. Takes wygovy.   Plan/Dispo at time of sign-out & ED Course since sign-out: [ ]  CT abd pelvis  BP (!) 135/104 (BP Location: Left Arm)   Pulse 65   Temp 98.6 F (37 C)   Resp 16   Ht 4\' 11"  (1.499 m)   Wt 111 kg   SpO2 100%   BMI 49.43 kg/m    ED Course:   Clinical Course as of 04/05/23 1927  Mon Apr 05, 2023  1656 CT ABDOMEN PELVIS W CONTRAST No acute findings. Diverticula without diverticulitis, sliding hiatal hernia, and left mildly complex ovarian cyst. [HN]  1732 Patient PO challenging. Keeping down liquids but having more nausea/pain with crackers. Last received zofran 7 hours ago. Will redose 4 mg IV zofran and reassess.  [HN]  1909 After multiple rounds IV antiemetics patient still is unable to eat. She has severe pain/nausea in her abdomen with a bite of crackers. Patient will need to be admitted for inability to tolerate PO, likely d/t wegovy. Consulted to hospitalist. [HN]    Clinical Course User Index [HN] Loetta Rough, MD    Dispo: Admit to medicine ------------------------------- Vivi Barrack, MD Emergency Medicine  This note was created using dictation software, which may contain spelling or grammatical errors.   Loetta Rough, MD 04/05/23 [phone]

## 2023-04-05 NOTE — H&P (Signed)
History and Physical    Kimberly Wells ION:[id number] DOB: [birth year] DOA: 04/05/2023  PCP: Diamantina Providence, FNP   Chief Complaint:  n/v  HPI: Kimberly Wells is a 48 y.o. female with medical history significant of depression, obesity, GERD who presents department due to nausea vomiting.  Patient has been on Wegovy and then was switched to tirzepatide.  She has been taking GLP-1 medications for the last 6 months.  She has also a history of hiatal hernia with refractory GERD and takes voquezna.  She presented to the ER due to nausea vomiting and difficulty tolerating p.o.  She injection on Wednesday and states that since her last injection of torse appetite she has been having persistent nausea vomiting poor p.o. tolerance.  She ate some greasy tacos last Wednesday which may have precipitated her symptoms.  In the emergency department she was afebrile hemodynamically stable.  Labs were obtained which showed hemoglobin 14.3, CMP unrevealing, lipase within normal limits, urinalysis negative for infection.  Patient had CT abdomen pelvis which showed no acute abnormalities.  She was given Bentyl IV fluids and Reglan with persistent symptoms.  She was admitted for further workup.  On evaluation she states that since presentation her symptoms have improved.  She was placed on a clear liquid diet.   Review of Systems: Review of Systems  Constitutional:  Negative for chills and fever.  HENT: Negative.    Eyes: Negative.   Respiratory: Negative.    Cardiovascular: Negative.   Gastrointestinal:  Positive for abdominal pain, heartburn, nausea and vomiting.  Genitourinary: Negative.   Musculoskeletal: Negative.   Skin: Negative.   Neurological: Negative.   Endo/Heme/Allergies: Negative.   Psychiatric/Behavioral: Negative.       As per HPI otherwise 10 point review of systems negative.   No Known Allergies  Past Medical History:  Diagnosis Date   ADHD    Anemia    Anxiety    Asthma     B12 deficiency    Depression    Depression    Eczema    GERD (gastroesophageal reflux disease)    Joint pain    Kidney infection    Lactose intolerance    Lymphedema    Multiple food allergies    Sleep apnea    Vitamin D deficiency     Past Surgical History:  Procedure Laterality Date   TONSILLECTOMY       reports that she has never smoked. She has never used smokeless tobacco. She reports that she does not drink alcohol and does not use drugs.  Family History  Problem Relation Age of Onset   Depression Mother    Anxiety disorder Mother    Hypertension Mother    Hyperlipidemia Mother    Heart disease Mother    Obesity Mother    Depression Father     Prior to Admission medications   Medication Sig Start Date End Date Taking? Authorizing Provider  albuterol (VENTOLIN HFA) 108 (90 Base) MCG/ACT inhaler Inhale 1-2 puffs into the lungs every 6 (six) hours as needed for wheezing or shortness of breath. 04/01/22   Gustavus Bryant, FNP  azithromycin (ZITHROMAX) 250 MG tablet Take 1 tablet (250 mg total) by mouth daily. Take first 2 tablets together, then 1 every day until finished. 04/05/22   White, Elita Boone, NP  benzonatate (TESSALON) 100 MG capsule Take 1 capsule (100 mg total) by mouth every 8 (eight) hours as needed for cough. 04/01/22   Gustavus Bryant, FNP  budesonide-formoterol (SYMBICORT) 160-4.5 MCG/ACT inhaler Inhale 2 puffs into the lungs 2 (two) times daily. 10/06/19   Marcelyn Bruins, MD  dexmethylphenidate (FOCALIN XR) 10 MG 24 hr capsule Take 20 mg by mouth daily. 06/19/19   [provider]  esomeprazole (NEXIUM) 40 MG capsule TAKE 1 CAPSULE (40 MG TOTAL) BY MOUTH DAILY AT 12 NOON. 10/02/19   Helane Rima, DO  ferrous sulfate 325 (65 FE) MG EC tablet Take by mouth. 10/27/18   [provider]  fluticasone (FLONASE) 50 MCG/ACT nasal spray Place 1 spray into both nostrils daily. 04/01/22   Gustavus Bryant, FNP  furosemide (LASIX) 20 MG tablet Take 10  mg by mouth daily.  10/27/18   [provider]  metFORMIN (GLUCOPHAGE) 500 MG tablet TAKE 1 TABLET BY MOUTH EVERY DAY WITH BREAKFAST 10/02/19   Helane Rima, DO  omeprazole (PRILOSEC) 20 MG capsule Take 1 capsule (20 mg total) by mouth daily. 10/06/19 11/05/19  Graciella Freer A, PA-C  phenol (CHLORASEPTIC) 1.4 % LIQD Use as directed 1 spray in the mouth or throat as needed for throat irritation / pain. 04/01/22   Gustavus Bryant, FNP  predniSONE (DELTASONE) 20 MG tablet Take 2 tablets (40 mg total) by mouth daily. 04/05/22   Valinda Hoar, NP  promethazine-dextromethorphan (PROMETHAZINE-DM) 6.25-15 MG/5ML syrup Take 5 mLs by mouth at bedtime as needed for cough. 04/05/22   Valinda Hoar, NP  Semaglutide-Weight Management (WEGOVY) 0.25 MG/0.5ML SOAJ Inject 0.25 mg into the skin once a week. 04/08/22     sucralfate (CARAFATE) 1 g tablet Take 1 tablet (1 g total) by mouth 4 (four) times daily -  with meals and at bedtime for 21 days. 10/06/19 10/27/19  Maxwell Caul, PA-C  topiramate (TOPAMAX) 50 MG tablet TAKE 1 TABLET (50 MG TOTAL) BY MOUTH DAILY WITH SUPPER. 10/02/19   Helane Rima, DO    Physical Exam: Vitals:   04/05/23 1636 04/05/23 1738 04/05/23 2043 04/05/23 2221  BP: (!) 135/104  138/74 (!) 142/88  Pulse: 65  66 81  Resp: 16  19 18   Temp:  98.6 F (37 C) 98.3 F (36.8 C) 98.1 F (36.7 C)  TempSrc:   Oral Oral  SpO2: 100%  100% 100%  Weight:      Height:       Physical Exam Vitals reviewed.  Constitutional:      Appearance: She is normal weight.  HENT:     Head: Normocephalic.     Nose: Nose normal.     Mouth/Throat:     Mouth: Mucous membranes are moist.     Pharynx: Oropharynx is clear.  Eyes:     Pupils: Pupils are equal, round, and reactive to light.  Cardiovascular:     Rate and Rhythm: Normal rate and regular rhythm.     Pulses: Normal pulses.     Heart sounds: Normal heart sounds.  Pulmonary:     Effort: Pulmonary effort is normal.     Breath  sounds: Normal breath sounds.  Abdominal:     General: Abdomen is flat.  Musculoskeletal:        General: Normal range of motion.     Cervical back: Normal range of motion.  Skin:    General: Skin is warm.     Capillary Refill: Capillary refill takes less than 2 seconds.  Neurological:     General: No focal deficit present.     Mental Status: She is alert.  Psychiatric:  Mood and Affect: Mood normal.        Labs on Admission: I have personally reviewed the patients's labs and imaging studies.  Assessment/Plan Principal Problem:   Intractable nausea and vomiting   # Intractable nausea vomiting most likely secondary to GLP-1 inhibitor - Patient takes 3 separate diet and has worsening symptoms since initiation of medication - Patient has lost 40 pounds on medications thus far - Given Reglan and IV fluids in emergency department with refractory symptoms  Plan: As needed Phenergan Add Pepcid at night Twice daily PPI Clear liquid diet  # Depression-continue Prozac 10 mg daily   Admission status: Observation Med-Surg  Certification: The appropriate patient status for this patient is OBSERVATION. Observation status is judged to be reasonable and necessary in order to provide the required intensity of service to ensure the patient's safety. The patient's presenting symptoms, physical exam findings, and initial radiographic and laboratory data in the context of their medical condition is felt to place them at decreased risk for further clinical deterioration. Furthermore, it is anticipated that the patient will be medically stable for discharge from the hospital within 2 midnights of admission.     Alan Mulder MD [facility] If 7PM-7AM, please contact night-coverage [email or web address]  04/05/2023, 11:01 PM

## 2023-04-05 NOTE — ED Triage Notes (Signed)
Pt states she is on a medication for weight loss. Reports abdominal pain, unable to keep meds down. Constant vomiting. Endorses has G.I issues. Called PCP and advised to come to ED.  Haven't had a BM since Friday. Reports haven't urinated since yesterday morning.

## 2023-04-06 DIAGNOSIS — R112 Nausea with vomiting, unspecified: Secondary | ICD-10-CM | POA: Diagnosis not present

## 2023-04-06 DIAGNOSIS — R1013 Epigastric pain: Secondary | ICD-10-CM | POA: Diagnosis not present

## 2023-04-06 LAB — BASIC METABOLIC PANEL
Anion gap: 10 (ref 5–15)
BUN: 5 mg/dL — ABNORMAL LOW (ref 6–20)
CO2: 22 mmol/L (ref 22–32)
Calcium: 8.9 mg/dL (ref 8.9–10.3)
Chloride: 104 mmol/L (ref 98–111)
Creatinine, Ser: 0.84 mg/dL (ref 0.44–1.00)
GFR, Estimated: >60 mL/min (ref >60)
Glucose, Bld: 80 mg/dL (ref 70–99)
Potassium: 3.5 mmol/L (ref 3.5–5.1)
Sodium: 136 mmol/L (ref 135–145)

## 2023-04-06 MED ORDER — DEXTROMETHORPHAN POLISTIREX ER 30 MG/5ML PO SUER
30.0000 mg | Freq: Two times a day (BID) | ORAL | Status: DC
  Administered 2023-04-06 – 2023-04-07 (×3): 30 mg via ORAL
  Filled 2023-04-06 (×3): qty 5

## 2023-04-06 MED ORDER — ENSURE MAX PROTEIN PO LIQD: 11.0000 [oz_av] | Freq: Two times a day (BID) | ORAL | Status: DC

## 2023-04-06 MED ORDER — METOCLOPRAMIDE HCL 5 MG/ML IJ SOLN
10.0000 mg | Freq: Four times a day (QID) | INTRAMUSCULAR | Status: DC
  Administered 2023-04-06 – 2023-04-07 (×4): 10 mg via INTRAVENOUS
  Filled 2023-04-06 (×4): qty 2

## 2023-04-06 MED ORDER — LORATADINE 10 MG PO TABS
10.0000 mg | ORAL_TABLET | Freq: Every day | ORAL | Status: DC
  Administered 2023-04-06 – 2023-04-07 (×2): 10 mg via ORAL
  Filled 2023-04-06 (×2): qty 1

## 2023-04-06 MED ORDER — HYDROXYZINE HCL 25 MG PO TABS
25.0000 mg | ORAL_TABLET | Freq: Once | ORAL | Status: AC | PRN
  Administered 2023-04-06: 25 mg via ORAL
  Filled 2023-04-06: qty 1

## 2023-04-06 MED ORDER — ALBUTEROL SULFATE (2.5 MG/3ML) 0.083% IN NEBU
2.5000 mg | INHALATION_SOLUTION | RESPIRATORY_TRACT | Status: DC | PRN
  Administered 2023-04-06: 2.5 mg via RESPIRATORY_TRACT
  Filled 2023-04-06: qty 3

## 2023-04-06 MED ORDER — FLEET ENEMA RE ENEM
1.0000 | ENEMA | Freq: Every day | RECTAL | Status: DC | PRN
  Administered 2023-04-06: 1 via RECTAL
  Filled 2023-04-06: qty 1

## 2023-04-06 MED ORDER — GUAIFENESIN ER 600 MG PO TB12
600.0000 mg | ORAL_TABLET | Freq: Two times a day (BID) | ORAL | Status: DC
  Administered 2023-04-06 – 2023-04-07 (×3): 600 mg via ORAL
  Filled 2023-04-06 (×3): qty 1

## 2023-04-06 MED ORDER — BISACODYL 10 MG RE SUPP
10.0000 mg | Freq: Once | RECTAL | Status: AC
  Administered 2023-04-06: 10 mg via RECTAL
  Filled 2023-04-06: qty 1

## 2023-04-06 MED ORDER — ALBUTEROL SULFATE HFA 108 (90 BASE) MCG/ACT IN AERS: 2.0000 | INHALATION_SPRAY | RESPIRATORY_TRACT | Status: DC | PRN

## 2023-04-06 MED ORDER — ENOXAPARIN SODIUM 40 MG/0.4ML IJ SOSY
40.0000 mg | PREFILLED_SYRINGE | Freq: Every day | INTRAMUSCULAR | Status: DC
  Administered 2023-04-06: 40 mg via SUBCUTANEOUS
  Filled 2023-04-06: qty 0.4

## 2023-04-06 MED ORDER — BENZONATATE 100 MG PO CAPS: 100.0000 mg | ORAL_CAPSULE | Freq: Three times a day (TID) | ORAL | Status: DC | PRN

## 2023-04-06 MED ORDER — POLYETHYLENE GLYCOL 3350 17 G PO PACK
17.0000 g | PACK | Freq: Every day | ORAL | Status: DC
  Administered 2023-04-06 – 2023-04-07 (×2): 17 g via ORAL
  Filled 2023-04-06 (×2): qty 1

## 2023-04-06 NOTE — Progress Notes (Signed)
Initial Nutrition Assessment  DOCUMENTATION CODES:   Obesity unspecified  INTERVENTION:   Once diet advanced: -Ensure MAX Protein po BID, each supplement provides 150 kcal and 30 grams of protein   -Recommended low fiber, low fat foods for better tolerance  -Recommend referral for outpatient diet education at [facility] following discharge  -Added "GERD" diet handout in AVS  NUTRITION DIAGNOSIS:   Inadequate oral intake related to nausea, vomiting as evidenced by per patient/family report.  GOAL:   Patient will meet greater than or equal to 90% of their needs  MONITOR:   PO intake, Supplement acceptance  REASON FOR ASSESSMENT:   Consult Assessment of nutrition requirement/status, Diet education  ASSESSMENT:   48 y.o. female with medical history significant of depression, obesity, GERD who presents department due to nausea vomiting.  Patient has been on Wegovy and then was switched to tirzepatide.  She has been taking GLP-1 medications for the last 6 months.  She has also a history of hiatal hernia with refractory GERD and takes voquezna.  She presented to the ER due to nausea vomiting and difficulty tolerating p.o.  She injection on Wednesday and states that since her last injection of torse appetite she has been having persistent nausea vomiting poor p.o. tolerance  Patient in room, states frustration with symptoms. Is trying to eat the best she can but with N/V feels limited and doesn't know what to eat. Ate bites of oatmeal and grits this morning. Was told to not eat red meats and doesn't know what to buy at the grocery store. Pt reports being on Wegovy for the past 6 months, had some side effects but nothing concerning. Has had successful weight loss (~40 lbs). Was recently switched to [facility] and dosage was changed d/t insurance and pt is not tolerating new dosage. Has not tolerated PO other than soup for a week now and is at risk of  malnutrition.  We reviewed bland foods to try. Likes [employer] at home so was going to [employer] which is similar. Now pt is on clear liquids so will reorder once diet is advanced. Pt reports bad reflux so we discussed foods to avoid that could worsen or trigger symptoms. Pt drinks a lot of caffeine so could start limiting that.  Pt would like diet education in the outpatient setting so will place referral.   Medications: Dulcolax, Reglan, Miralax  Labs reviewed.  NUTRITION - FOCUSED PHYSICAL EXAM:  No depletions noted  Diet Order:   Diet Order             Diet clear liquid Room service appropriate? Yes; Fluid consistency: Thin  Diet effective now                   EDUCATION NEEDS:   No education needs have been identified at this time  Skin:  Skin Assessment: Reviewed RN Assessment  Last BM:  2/18 -type 1  Height:   Ht Readings from Last 1 Encounters:  04/05/23 4\' 11"  (1.499 m)    Weight:   Wt Readings from Last 1 Encounters:  04/05/23 101.2 kg     BMI:  Body mass index is 45.06 kg/m.  Estimated Nutritional Needs:   Kcal:  1500-1700  Protein:  70-90g  Fluid:  1.7L/day  Tilda Franco, MS, RD, LDN Inpatient Clinical Dietitian Contact via Secure chat

## 2023-04-06 NOTE — Progress Notes (Signed)
[facility]  PROGRESS NOTE  RONDIA HIGGINBOTHAM ZOX:[id number] DOB: [birth year] DOA: 04/05/2023 PCP: Diamantina Providence, FNP   Brief HPI:   48 y.o. female with medical history significant of depression, obesity, GERD who presents department due to nausea vomiting.  Patient has been on Wegovy and then was switched to tirzepatide.  She has been taking GLP-1 medications for the last 6 months.  She has also a history of hiatal hernia with refractory GERD and takes voquezna.  She presented to the ER due to nausea vomiting and difficulty tolerating p.o.  She injection on Wednesday and states that since her last injection of torse appetite she has been having persistent nausea vomiting poor p.o. tolerance     Assessment/Plan:      # Intractable nausea vomiting most likely secondary to GLP-1 inhibitor - Patient  has worsening symptoms since initiation of medication - Patient has lost 40 pounds on medications thus far - Symptoms improved with Reglan.  Will start Reglan 10 mg IV every 6 hours -Continue pantoprazole, Phenergan as needed   Cough -Start dextromethorphan as needed -Mucinex 600 and p.o. twice daily -Claritin 10 mg daily   # Depression -continue Prozac 10 mg daily    Medications     enoxaparin (LOVENOX) injection  55 mg Subcutaneous QHS   famotidine  40 mg Oral QHS   ferrous sulfate  325 mg Oral Q breakfast   FLUoxetine  10 mg Oral Daily   loratadine  10 mg Oral Daily   melatonin  3 mg Oral QHS   pantoprazole  40 mg Oral BID     Data Reviewed:   CBG:  No results for input(s): "GLUCAP" in the last 168 hours.  SpO2: 98 %    Vitals:   04/05/23 2043 04/05/23 2221 04/06/23 0220 04/06/23 0612  BP: 138/74 (!) 142/88 106/60 (!) 108/53  Pulse: 66 81 78 80  Resp: 19 18 18 16   Temp: 98.3 F (36.8 C) 98.1 F (36.7 C) 98.1 F (36.7 C) 97.9 F (36.6 C)  TempSrc: Oral Oral Oral Oral  SpO2: 100% 100% 96% 98%  Weight:  101.2 kg    Height:  4\' 11"  (1.499 m)         Data Reviewed:  Basic Metabolic Panel: Recent Labs  Lab 04/05/23 1139  NA 136  K 3.6  CL 106  CO2 19*  GLUCOSE 78  BUN 8  CREATININE 0.76  CALCIUM 9.1    CBC: Recent Labs  Lab 04/05/23 1139  WBC 6.4  NEUTROABS 4.3  HGB 14.3  HCT 47.0*  MCV 86.9  PLT 411*    LFT Recent Labs  Lab 04/05/23 1139  AST 16  ALT 13  ALKPHOS 75  BILITOT 0.5  PROT 8.0  ALBUMIN 3.8     Antibiotics: Anti-infectives (From admission, onward)    None        DVT prophylaxis: Lovenox  Code Status: Full code  Family Communication: No family at bedside   CONSULTS    Subjective   Complains of nasal congestion and coughing.  No nausea or vomiting.  Reglan helped with nausea.  Patient takes [facility] for weight loss, has been having worsening of symptoms since starting Mounjaro.   Objective    Physical Examination:   General-appears in no acute distress Heart-S1-S2, regular, no murmur auscultated Lungs-clear to auscultation bilaterally, no wheezing or crackles auscultated Abdomen-soft, nontender, no organomegaly Extremities-no edema in the lower extremities Neuro-alert, oriented x3, no focal deficit noted   Status  is: Inpatient:             Meredeth Ide   [facility] If 7PM-7AM, please contact night-coverage at [email or web address], Office  [phone]   04/06/2023, 8:23 AM  LOS: 0 days

## 2023-04-06 NOTE — Plan of Care (Signed)

## 2023-04-07 ENCOUNTER — Other Ambulatory Visit (HOSPITAL_COMMUNITY): Payer: Self-pay

## 2023-04-07 ENCOUNTER — Other Ambulatory Visit (HOSPITAL_BASED_OUTPATIENT_CLINIC_OR_DEPARTMENT_OTHER): Payer: Self-pay

## 2023-04-07 DIAGNOSIS — R112 Nausea with vomiting, unspecified: Secondary | ICD-10-CM | POA: Diagnosis not present

## 2023-04-07 LAB — BASIC METABOLIC PANEL
Anion gap: 7 (ref 5–15)
BUN: 5 mg/dL — ABNORMAL LOW (ref 6–20)
CO2: 23 mmol/L (ref 22–32)
Calcium: 8.7 mg/dL — ABNORMAL LOW (ref 8.9–10.3)
Chloride: 108 mmol/L (ref 98–111)
Creatinine, Ser: 0.81 mg/dL (ref 0.44–1.00)
GFR, Estimated: >60 mL/min (ref >60)
Glucose, Bld: 87 mg/dL (ref 70–99)
Potassium: 3.3 mmol/L — ABNORMAL LOW (ref 3.5–5.1)
Sodium: 138 mmol/L (ref 135–145)

## 2023-04-07 MED ORDER — POTASSIUM CHLORIDE CRYS ER 20 MEQ PO TBCR
40.0000 meq | EXTENDED_RELEASE_TABLET | Freq: Once | ORAL | Status: AC
  Administered 2023-04-07: 40 meq via ORAL
  Filled 2023-04-07: qty 2

## 2023-04-07 MED ORDER — METOCLOPRAMIDE HCL 10 MG PO TABS
10.0000 mg | ORAL_TABLET | Freq: Three times a day (TID) | ORAL | 0 refills | Status: AC | PRN
  Filled 2023-04-07: qty 20, 7d supply, fill #0

## 2023-04-07 NOTE — Plan of Care (Signed)

## 2023-04-07 NOTE — Discharge Summary (Signed)
Physician Discharge Summary  Kimberly Wells ZOX:[id number] DOB: 02-12-1976 DOA: 04/05/2023  PCP: Diamantina Providence, FNP  Admit date: 04/05/2023 Discharge date: 04/07/2023  Admitted From: Home Disposition:  Home  Discharge Condition:Stable CODE STATUS:FULL Diet recommendation: Regular   Brief/Interim Summary: 48 y.o. female with medical history significant of depression, obesity, GERD who presents department due to nausea vomiting.  Patient has been on Wegovy and then was switched to tirzepatide.  She has been taking GLP-1 medications for the last 6 months.  She has also a history of hiatal hernia with refractory GERD and takes voquezna.  She presented to the ER due to nausea vomiting and difficulty tolerating p.o. CT abdomen/pelvis did not show any acute findings.  Diet gradually advanced and she had tolerated soft diet today.  Medically stable for discharge.  Following problems were addressed during the hospitalization:  Intractable nausea vomiting most likely secondary to GLP-1 inhibitor - Patient  has worsening symptoms since initiation of medication,Zepbound - Patient has lost 40 pounds on medications thus far - Symptoms improved with Reglan.  -Abdominal imaging did not show any acute findings.  She tolerated soft diet today.  No nausea or vomiting this morning.   Cough -Resolved   Depression -continue Prozac 10 mg daily  Hypokalemia: Supplemented with potassium  Discharge Diagnoses:  Principal Problem:   Intractable nausea and vomiting    Discharge Instructions  Discharge Instructions     Diet general   Complete by: As directed    Discharge instructions   Complete by: As directed    1)Please follow-up with your PCP in a week.  We are recommending to stop taking Zepbound, discuss with your PCP about this   Increase activity slowly   Complete by: As directed       Allergies as of 04/07/2023   No Known Allergies      Medication List     STOP taking these  medications    amoxicillin 500 MG capsule Commonly known as: AMOXIL   azithromycin 250 MG tablet Commonly known as: ZITHROMAX   benzonatate 100 MG capsule Commonly known as: TESSALON   Chloraseptic 1.4 % Liqd Generic drug: phenol   methylPREDNISolone 4 MG Tbpk tablet Commonly known as: MEDROL DOSEPAK   predniSONE 20 MG tablet Commonly known as: DELTASONE   promethazine-dextromethorphan 6.25-15 MG/5ML syrup Commonly known as: PROMETHAZINE-DM   Wegovy 0.25 MG/0.5ML Soaj Generic drug: Semaglutide-Weight Management   Zepbound 5 MG/0.5ML Pen Generic drug: tirzepatide       TAKE these medications    albuterol 108 (90 Base) MCG/ACT inhaler Commonly known as: VENTOLIN HFA Inhale 1-2 puffs into the lungs every 6 (six) hours as needed for wheezing or shortness of breath.   amphetamine-dextroamphetamine 15 MG 24 hr capsule Commonly known as: ADDERALL XR Take 15 mg by mouth daily.   budesonide-formoterol 160-4.5 MCG/ACT inhaler Commonly known as: Symbicort Inhale 2 puffs into the lungs 2 (two) times daily. What changed:  when to take this reasons to take this   cetirizine 10 MG tablet Commonly known as: ZYRTEC Take 10 mg by mouth daily as needed for allergies.   esomeprazole 40 MG capsule Commonly known as: NEXIUM TAKE 1 CAPSULE (40 MG TOTAL) BY MOUTH DAILY AT 12 NOON. What changed:  when to take this reasons to take this   ferrous sulfate 325 (65 FE) MG EC tablet Take 325 mg by mouth daily.   FLUoxetine 20 MG capsule Commonly known as: PROZAC Take 20 mg by mouth daily.  fluticasone 50 MCG/ACT nasal spray Commonly known as: FLONASE Place 1 spray into both nostrils daily. What changed:  when to take this reasons to take this   hydrochlorothiazide 12.5 MG tablet Commonly known as: HYDRODIURIL Take 12.5 mg by mouth daily.   HYDROcodone bit-homatropine 5-1.5 MG/5ML syrup Commonly known as: HYCODAN Take 5 mLs by mouth every 4 (four) hours as needed for  cough.   hydrOXYzine 25 MG tablet Commonly known as: ATARAX Take 50 mg by mouth at bedtime.   MAGNESIUM PO Take 1 tablet by mouth daily.   metoCLOPramide 10 MG tablet Commonly known as: REGLAN Take 1 tablet (10 mg total) by mouth every 8 (eight) hours as needed for nausea.   MULTIVITAL PO Take 1 tablet by mouth daily.   OMEGA 3 PO Take 1 capsule by mouth daily.   omeprazole 20 MG capsule Commonly known as: PRILOSEC Take 1 capsule (20 mg total) by mouth daily. What changed:  when to take this reasons to take this   VITAMIN D PO Take 1 tablet by mouth daily.   Voquezna 20 MG Tabs Generic drug: Vonoprazan Fumarate Take 20 mg by mouth daily as needed (Indigestion).        Follow-up Information     Diamantina Providence, FNP. Schedule an appointment as soon as possible for a visit in 1 week(s).   Specialty: Nurse Practitioner Contact information: [address] [place] [address] [phone]                No Known Allergies  Consultations: None   Procedures/Studies: CT ABDOMEN PELVIS W CONTRAST Result Date: 04/05/2023 CLINICAL DATA:  Abdominal pain, acute, nonlocalized. EXAM: CT ABDOMEN AND PELVIS WITH CONTRAST TECHNIQUE: Multidetector CT imaging of the abdomen and pelvis was performed using the standard protocol following bolus administration of intravenous contrast. RADIATION DOSE REDUCTION: This exam was performed according to the departmental dose-optimization program which includes automated exposure control, adjustment of the mA and/or kV according to patient size and/or use of iterative reconstruction technique. CONTRAST:  OMNIPAQUE IOHEXOL 300 MG/ML  SOLN COMPARISON:  None Available. FINDINGS: Lower chest: The lung bases are clear. No pleural effusion. The heart is normal in size. No pericardial effusion. Hepatobiliary: The liver is normal in size. Non-cirrhotic configuration. No suspicious mass. These is mild diffuse hepatic  steatosis. No intrahepatic or extrahepatic bile duct dilation. No calcified gallstones. Normal gallbladder wall thickness. No pericholecystic inflammatory changes. Pancreas: Unremarkable. No pancreatic ductal dilatation or surrounding inflammatory changes. Spleen: Within normal limits. No focal lesion. Adrenals/Urinary Tract: Adrenal glands are unremarkable. No suspicious renal mass. There is a 8 x 11 mm simple cyst in the right kidney upper pole laterally. No nephroureterolithiasis or obstructive uropathy. Unremarkable urinary bladder. Stomach/Bowel: There is a small sliding hiatal hernia. No disproportionate dilation of the small or large bowel loops. No evidence of abnormal bowel wall thickening or inflammatory changes. The appendix is unremarkable. There are multiple diverticula mainly in the left hemi colon, without imaging signs of diverticulitis. Vascular/Lymphatic: No ascites or pneumoperitoneum. No abdominal or pelvic lymphadenopathy, by size criteria. No aneurysmal dilation of the major abdominal arteries. Reproductive: Evaluation of reproductive organs is limited on the CT scan exam. However, having said that T-shaped intrauterine device noted, which appears in satisfactory position. Uterus otherwise appears unremarkable. There is a well circumscribed 2.8 x 3.3 cm structure in the left adnexa most likely ovarian in etiology. It has internal CT attenuation of 25-26 Hounsfield units. This is favored to  represent mildly complex ovarian cyst. Correlate clinically to determine the need for further characterisation of the structure with pelvic ultrasound. Unremarkable right ovary. Other: The visualized soft tissues and abdominal wall are unremarkable. Musculoskeletal: No suspicious osseous lesions. IMPRESSION: *No acute inflammatory process identified within the abdomen or pelvis. *Multiple other nonacute observations, as described above. Electronically Signed   By: Jules Schick M.D.   On: 04/05/2023 16:45       Subjective: Patient seen and examined at bedside today.  Hemodynamically stable.  Comfortable.  Tolerated soft diet today.  Denies any abdomen pain, nausea or vomiting this morning.  Eager to go home.  Discharge Exam: Vitals:   04/06/23 2209 04/07/23 0608  BP: 107/66 97/62  Pulse: 76 70  Resp: 18 18  Temp: 98 F (36.7 C) 97.9 F (36.6 C)  SpO2: 96% 97%   Vitals:   04/06/23 1118 04/06/23 1321 04/06/23 2209 04/07/23 0608  BP:  117/76 107/66 97/62  Pulse:  78 76 70  Resp:  16 18 18   Temp:  98.3 F (36.8 C) 98 F (36.7 C) 97.9 F (36.6 C)  TempSrc:  Oral Oral Oral  SpO2: 100% 100% 96% 97%  Weight:      Height:        General: Pt is alert, awake, not in acute distress Cardiovascular: RRR, S1/S2 +, no rubs, no gallops Respiratory: CTA bilaterally, no wheezing, no rhonchi Abdominal: Soft, NT, ND, bowel sounds + Extremities: no edema, no cyanosis    The results of significant diagnostics from this hospitalization (including imaging, microbiology, ancillary and laboratory) are listed below for reference.     Microbiology: No results found for this or any previous visit (from the past 240 hours).   Labs: BNP (last 3 results) No results for input(s): "BNP" in the last 8760 hours. Basic Metabolic Panel: Recent Labs  Lab 04/05/23 1139 04/06/23 0945 04/07/23 0601  NA 136 136 138  K 3.6 3.5 3.3*  CL 106 104 108  CO2 19* 22 23  GLUCOSE 78 80 87  BUN 8 5* 5*  CREATININE 0.76 0.84 0.81  CALCIUM 9.1 8.9 8.7*   Liver Function Tests: Recent Labs  Lab 04/05/23 1139  AST 16  ALT 13  ALKPHOS 75  BILITOT 0.5  PROT 8.0  ALBUMIN 3.8   Recent Labs  Lab 04/05/23 1139  LIPASE 28   No results for input(s): "AMMONIA" in the last 168 hours. CBC: Recent Labs  Lab 04/05/23 1139  WBC 6.4  NEUTROABS 4.3  HGB 14.3  HCT 47.0*  MCV 86.9  PLT 411*   Cardiac Enzymes: No results for input(s): "CKTOTAL", "CKMB", "CKMBINDEX", "TROPONINI" in the last 168  hours. BNP: Invalid input(s): "POCBNP" CBG: No results for input(s): "GLUCAP" in the last 168 hours. D-Dimer No results for input(s): "DDIMER" in the last 72 hours. Hgb A1c No results for input(s): "HGBA1C" in the last 72 hours. Lipid Profile No results for input(s): "CHOL", "HDL", "LDLCALC", "TRIG", "CHOLHDL", "LDLDIRECT" in the last 72 hours. Thyroid function studies No results for input(s): "TSH", "T4TOTAL", "T3FREE", "THYROIDAB" in the last 72 hours.  Invalid input(s): "FREET3" Anemia work up No results for input(s): "VITAMINB12", "FOLATE", "FERRITIN", "TIBC", "IRON", "RETICCTPCT" in the last 72 hours. Urinalysis    Component Value Date/Time   COLORURINE YELLOW 04/05/2023 1333   APPEARANCEUR HAZY (A) 04/05/2023 1333   LABSPEC 1.014 04/05/2023 1333   PHURINE 7.0 04/05/2023 1333   GLUCOSEU NEGATIVE 04/05/2023 1333   HGBUR SMALL (A) 04/05/2023 1333  BILIRUBINUR NEGATIVE 04/05/2023 1333   KETONESUR 5 (A) 04/05/2023 1333   PROTEINUR NEGATIVE 04/05/2023 1333   UROBILINOGEN 0.2 08/08/2013 2243   NITRITE NEGATIVE 04/05/2023 1333   LEUKOCYTESUR NEGATIVE 04/05/2023 1333   Sepsis Labs Recent Labs  Lab 04/05/23 1139  WBC 6.4   Microbiology No results found for this or any previous visit (from the past 240 hours).  Please note: You were cared for by a hospitalist during your hospital stay. Once you are discharged, your primary care physician will handle any further medical issues. Please note that NO REFILLS for any discharge medications will be authorized once you are discharged, as it is imperative that you return to your primary care physician (or establish a relationship with a primary care physician if you do not have one) for your post hospital discharge needs so that they can reassess your need for medications and monitor your lab values.    Time coordinating discharge: 40 minutes  SIGNED:   Burnadette Pop, MD  [facility] 04/07/2023, 10:59 AM Pager  [phone]  If 7PM-7AM, please contact night-coverage [email or web address] Password [email or web address]

## 2023-04-07 NOTE — Plan of Care (Signed)

## 2023-04-15 ENCOUNTER — Encounter: Payer: Self-pay | Admitting: Allergy & Immunology

## 2023-04-15 ENCOUNTER — Other Ambulatory Visit: Payer: Self-pay

## 2023-04-15 ENCOUNTER — Ambulatory Visit (INDEPENDENT_AMBULATORY_CARE_PROVIDER_SITE_OTHER): Payer: 59 | Admitting: Allergy & Immunology

## 2023-04-15 VITALS — BP 134/88 | HR 80 | Temp 98.2°F | Ht 61.02 in | Wt 220.7 lb

## 2023-04-15 DIAGNOSIS — J3089 Other allergic rhinitis: Secondary | ICD-10-CM | POA: Diagnosis not present

## 2023-04-15 DIAGNOSIS — J4540 Moderate persistent asthma, uncomplicated: Secondary | ICD-10-CM | POA: Diagnosis not present

## 2023-04-15 DIAGNOSIS — J302 Other seasonal allergic rhinitis: Secondary | ICD-10-CM

## 2023-04-15 MED ORDER — BUDESONIDE-FORMOTEROL FUMARATE 160-4.5 MCG/ACT IN AERO: 2.0000 | INHALATION_SPRAY | Freq: Two times a day (BID) | RESPIRATORY_TRACT | 5 refills | Status: AC

## 2023-04-15 NOTE — Patient Instructions (Addendum)
 1. Moderate persistent asthma, uncomplicated (Primary) - Lung testing looked great today. - I think we need to get you on something daily to stay on top of your symptoms. - Let's get back on the Symbicort regularly for now. - We can certainly be less aggressive in the future if needed.  - Spacer use reviewed. - Daily controller medication(s): Symbicort 160/4.58mcg two puffs TWICE DAILY every day - Prior to physical activity: albuterol 2 puffs 10-15 minutes before physical activity. - Rescue medications: albuterol 4 puffs every 4-6 hours as needed and albuterol nebulizer one vial every 4-6 hours as needed - Changes during respiratory infections or worsening symptoms: Increase Symbicort to 2 puffs  four times daily for ONE TO TWO WEEKS. - Asthma control goals:  * Full participation in all desired activities (may need albuterol before activity) * Albuterol use two time or less a week on average (not counting use with activity) * Cough interfering with sleep two time or less a month * Oral steroids no more than once a year * No hospitalizations  2. Seasonal and perennial allergic rhinitis (trees, molds) - Continue with cetirizine 10mg  daily. - We can do more sensitive intradermal testing at the next visit as well.   3. Concern for food allergies - Because of insurance stipulations, we cannot do skin testing on the same day as your first visit. - We are all working to fight this, but for now we need to do two separate visits.  - We will know more after we do testing at the next visit.  - The skin testing visit can be squeezed in at your convenience.  - Then we can make a more full plan to address all of your symptoms. - Be sure to stop your antihistamines for 3 days before this appointment.   4. Return in about 1 week (around 04/22/2023) for SKIN TESTING. You can have the follow up appointment with Dr. Dellis Anes or a Nurse Practicioner (our Nurse Practitioners are excellent and always have  Physician oversight!).    Please inform us of any Emergency Department visits, hospitalizations, or changes in symptoms. Call us before going to the ED for breathing or allergy symptoms since we might be able to fit you in for a sick visit. Feel free to contact us anytime with any questions, problems, or concerns.  It was a pleasure to meet you today!  Websites that have reliable patient information: 1. American Academy of Asthma, Allergy, and Immunology: www.aaaai.org 2. Food Allergy Research and Education (FARE): foodallergy.org 3. Mothers of Asthmatics: http://www.asthmacommunitynetwork.org 4. American College of Allergy, Asthma, and Immunology: www.acaai.org      "Like" [place] on Facebook and Instagram for our latest updates!      A healthy democracy works best when [employer] participate! Make sure you are registered to vote! If you have moved or changed any of your contact information, you will need to get this updated before voting! Scan the QR codes below to learn more!

## 2023-04-15 NOTE — Progress Notes (Signed)
 NEW PATIENT  Date of Service/Encounter:  04/15/23  Consult requested by: Diamantina Providence, FNP   Assessment:   Moderate persistent asthma, uncomplicated  Seasonal and perennial allergic rhinitis  Concern for food allergies - without clear IgE-mediated symptoms  Plan/Recommendations:   1. Moderate persistent asthma, uncomplicated (Primary) - Lung testing looked great today. - I think we need to get you on something daily to stay on top of your symptoms. - Let's get back on the Symbicort regularly for now. - We can certainly be less aggressive in the future if needed.  - Spacer use reviewed. - Daily controller medication(s): Symbicort 160/4.54mcg two puffs TWICE DAILY every day - Prior to physical activity: albuterol 2 puffs 10-15 minutes before physical activity. - Rescue medications: albuterol 4 puffs every 4-6 hours as needed and albuterol nebulizer one vial every 4-6 hours as needed - Changes during respiratory infections or worsening symptoms: Increase Symbicort to 2 puffs  four times daily for ONE TO TWO WEEKS. - Asthma control goals:  * Full participation in all desired activities (may need albuterol before activity) * Albuterol use two time or less a week on average (not counting use with activity) * Cough interfering with sleep two time or less a month * Oral steroids no more than once a year * No hospitalizations  2. Seasonal and perennial allergic rhinitis (trees, molds) - Continue with cetirizine 10mg  daily. - We can do more sensitive intradermal testing at the next visit as well.   3. Concern for food allergies - Because of insurance stipulations, we cannot do skin testing on the same day as your first visit. - We are all working to fight this, but for now we need to do two separate visits.  - We will know more after we do testing at the next visit.  - The skin testing visit can be squeezed in at your convenience.  - Then we can make a more full plan to  address all of your symptoms. - Be sure to stop your antihistamines for 3 days before this appointment.   4. Return in about 1 week (around 04/22/2023) for SKIN TESTING. You can have the follow up appointment with Dr. Dellis Anes or a Nurse Practicioner (our Nurse Practitioners are excellent and always have Physician oversight!).    This note in its entirety was forwarded to the Provider who requested this consultation.  Subjective:   Kimberly Wells is a 48 y.o. female presenting today for evaluation of  Chief Complaint  Patient presents with   Allergies   Asthma   Food Intolerance   Cough   Eczema   Establish Care    Kimberly Wells has a history of the following: Patient Active Problem List   Diagnosis Date Noted   Intractable nausea and vomiting 04/05/2023   Depression 12/19/2012    History obtained from: chart review and patient.  Discussed the use of AI scribe software for clinical note transcription with the patient and/or guardian, who gave verbal consent to proceed.  Kimberly Wells was referred by Diamantina Providence, FNP.     Kimberly Wells is a 48 y.o. female presenting for an evaluation of asthma and allergies .  She was actually last seen in August 2021.  At that time, she was started on Dexilant 60 mg daily to help with her reflux.  She was started on Symbicort 160 mcg 2 puffs twice daily and albuterol as needed.  For her environmental allergies, she had testing that  was positive to trees and mold. She was asked to follow up in 3 months, but she presents now nearly 4 years later.  In the interim, she has not been here in nearly 4 years. She said that she thought that we were closed.   Asthma/Respiratory Symptom History: She was hospitalized last week and received a breathing treatment due to severe symptoms, including uncontrollable coughing and shortness of breath. She has a nebulizer at home but hesitates to use it unless symptoms are severe. She has not been on  Symbicort. She is not sure whether she needed to use it or not.   Allergic Rhinitis Symptom History: She experiences a significant exacerbation of allergy symptoms, including hoarseness and a wet cough with mucus production. She has a history of sinus infections, flu, COVID-19, and pneumonia, with the most recent illness being a respiratory infection initially thought to be COVID-19. She is currently using Symbicort and cetirizine for her respiratory and allergy symptoms. Cetirizine provides some relief.   She lives in a new apartment in an area with ongoing construction, which she suspects might be contributing to her symptoms. She has a dog, a Lacretia Nicks, which she keeps well-groomed and has not previously caused her issues. She maintains a clean living environment, regularly steaming her carpets.   Food Allergy Symptom History: She has no particular food that she is concerned with causing her symptoms, but she thinks that foods might be related to what is going on now. She did have some testing done at the last visit and she was positive only to almond. It is not clear that she ever avoided it, however.   GERD Symptom History: She has a history of GERD, which she suspects might be contributing to her symptoms. Severe coughing and vomiting episodes have led to significant weight loss due to her reluctance to eat. She is currently taking a new medication for GERD, Equenza, and occasionally uses Pepcid for flare-ups. She has previously used Prilosec and Nexium but no longer takes them. Her symptoms worsen at night, particularly after eating certain foods like spinach and cucumbers, leading her to suspect a possible food allergy.  Otherwise, there is no history of other atopic diseases, including drug allergies, stinging insect allergies, or contact dermatitis. There is no significant infectious history. Vaccinations are up to date.    Past Medical History: Patient Active Problem List   Diagnosis Date  Noted   Intractable nausea and vomiting 04/05/2023   Depression 12/19/2012    Medication List:  Allergies as of 04/15/2023   No Known Allergies      Medication List        Accurate as of April 15, 2023 12:55 PM. If you have any questions, ask your nurse or doctor.          STOP taking these medications    esomeprazole 40 MG capsule Commonly known as: NEXIUM Stopped by: Alfonse Spruce   omeprazole 20 MG capsule Commonly known as: PRILOSEC Stopped by: Alfonse Spruce       TAKE these medications    albuterol 108 (90 Base) MCG/ACT inhaler Commonly known as: VENTOLIN HFA Inhale 1-2 puffs into the lungs every 6 (six) hours as needed for wheezing or shortness of breath.   amphetamine-dextroamphetamine 15 MG 24 hr capsule Commonly known as: ADDERALL XR Take 15 mg by mouth daily.   budesonide-formoterol 160-4.5 MCG/ACT inhaler Commonly known as: Symbicort Inhale 2 puffs into the lungs in the morning and at bedtime. What changed:  when to take this Changed by: Alfonse Spruce   cetirizine 10 MG tablet Commonly known as: ZYRTEC Take 10 mg by mouth daily as needed for allergies.   ferrous sulfate 325 (65 FE) MG EC tablet Take 325 mg by mouth daily.   FLUoxetine 20 MG capsule Commonly known as: PROZAC Take 20 mg by mouth daily.   fluticasone 50 MCG/ACT nasal spray Commonly known as: FLONASE Place 1 spray into both nostrils daily. What changed:  when to take this reasons to take this   hydrochlorothiazide 12.5 MG tablet Commonly known as: HYDRODIURIL Take 12.5 mg by mouth daily.   HYDROcodone bit-homatropine 5-1.5 MG/5ML syrup Commonly known as: HYCODAN Take 5 mLs by mouth every 4 (four) hours as needed for cough.   hydrOXYzine 25 MG tablet Commonly known as: ATARAX Take 50 mg by mouth at bedtime.   MAGNESIUM PO Take 1 tablet by mouth daily.   metoCLOPramide 10 MG tablet Commonly known as: REGLAN Take 1 tablet (10 mg total) by  mouth every 8 (eight) hours as needed for nausea.   MULTIVITAL PO Take 1 tablet by mouth daily.   OMEGA 3 PO Take 1 capsule by mouth daily.   VITAMIN D PO Take 1 tablet by mouth daily.   Voquezna 20 MG Tabs Generic drug: Vonoprazan Fumarate Take 20 mg by mouth daily as needed (Indigestion).        Birth History: non-contributory  Developmental History: non-contributory  Past Surgical History: Past Surgical History:  Procedure Laterality Date   TONSILLECTOMY       Family History: Family History  Problem Relation Age of Onset   Allergic rhinitis Mother    Depression Mother    Anxiety disorder Mother    Hypertension Mother    Hyperlipidemia Mother    Heart disease Mother    Obesity Mother    Depression Father    Urticaria Sister      Social History: [place] lives at home with her animals. She is not a smoker. She works from home. There is no smoking exposure at all. 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 She denies a smoking history.    Review of systems otherwise negative other than that mentioned in the HPI.    Objective:   Blood pressure 134/88, pulse 80, temperature 98.2 F (36.8 C), temperature source Temporal, height 5' 1.02" (1.55 m), weight 220 lb 11.2 oz (100.1 kg), SpO2 100%. Body mass index is 41.67 kg/m.     Physical Exam Vitals reviewed.  Constitutional:      Appearance: She is well-developed.  HENT:     Head: Normocephalic and atraumatic.     Right Ear: Tympanic membrane, ear canal and external ear normal. No drainage, swelling or tenderness. Tympanic membrane is not injected, scarred, erythematous, retracted or bulging.     Left Ear: Tympanic membrane, ear canal and external ear normal. No drainage, swelling or tenderness. Tympanic membrane is not injected, scarred, erythematous, retracted or bulging.     Nose: No nasal deformity, septal deviation, mucosal  edema or rhinorrhea.     Right Turbinates: Enlarged, swollen and pale.     Left Turbinates: Enlarged, swollen and pale.     Right Sinus: No maxillary sinus tenderness or frontal sinus tenderness.     Left Sinus: No maxillary sinus tenderness or frontal sinus tenderness.     Mouth/Throat:     Lips: Pink.     Mouth:  Mucous membranes are moist. Mucous membranes are not pale and not dry.     Pharynx: Uvula midline.  Eyes:     General:        Right eye: No discharge.        Left eye: No discharge.     Conjunctiva/sclera: Conjunctivae normal.     Right eye: Right conjunctiva is not injected. No chemosis.    Left eye: Left conjunctiva is not injected. No chemosis.    Pupils: Pupils are equal, round, and reactive to light.  Cardiovascular:     Rate and Rhythm: Normal rate and regular rhythm.     Heart sounds: Normal heart sounds.  Pulmonary:     Effort: Pulmonary effort is normal. No tachypnea, accessory muscle usage or respiratory distress.     Breath sounds: Normal breath sounds. No wheezing, rhonchi or rales.     Comments: Moving air well in all lung fields. No increased work of breathing noted.  Chest:     Chest wall: No tenderness.  Abdominal:     Tenderness: There is no abdominal tenderness. There is no guarding or rebound.  Lymphadenopathy:     Head:     Right side of head: No submandibular, tonsillar or occipital adenopathy.     Left side of head: No submandibular, tonsillar or occipital adenopathy.     Cervical: No cervical adenopathy.  Skin:    Coloration: Skin is not pale.     Findings: No abrasion, erythema, petechiae or rash. Rash is not papular, urticarial or vesicular.  Neurological:     Mental Status: She is alert.  Psychiatric:        Behavior: Behavior is cooperative.      Diagnostic studies:    Spirometry: results normal (FEV1: 2.18/98%, FVC: 2.54/93%, FEV1/FVC: 86%).   Spirometry consistent with normal pattern.    Allergy Studies: none           Malachi Bonds, MD [facility]

## 2023-04-16 ENCOUNTER — Other Ambulatory Visit (HOSPITAL_COMMUNITY): Payer: Self-pay

## 2023-04-16 ENCOUNTER — Telehealth: Payer: Self-pay

## 2023-04-16 NOTE — Telephone Encounter (Signed)
*  Asthma/Allergy  Pharmacy Patient Advocate Encounter   Received notification from CoverMyMeds that prior authorization for Symbicort 160-4.5MCG/ACT aerosol  is required/requested.   Insurance verification completed.   The patient is insured through [employer] .   Per test claim: PA required; PA submitted to above mentioned insurance via CoverMyMeds Key/confirmation #/EOC [id number] Status is pending

## 2023-04-19 ENCOUNTER — Other Ambulatory Visit (HOSPITAL_COMMUNITY): Payer: Self-pay

## 2023-04-19 NOTE — Telephone Encounter (Signed)
 Called and notified patient of the approval.

## 2023-04-19 NOTE — Telephone Encounter (Signed)
 Pharmacy Patient Advocate Encounter  Received notification from [employer] that Prior Authorization for Symbicort 160-4.5MCG/ACT aerosol has been APPROVED from 03-17-2023 to 04-15-2024. Ran test claim, Copay is $10.00. This test claim was processed through [facility]: [facility] Rh[facility] W- copay amounts may vary at other pharmacies due to pharmacy/plan contracts, or as the patient moves through the different stages of their insurance plan.   PA #/Case ID/Reference #: [id number]

## 2023-04-22 ENCOUNTER — Other Ambulatory Visit (HOSPITAL_COMMUNITY): Payer: Self-pay

## 2023-04-22 ENCOUNTER — Encounter: Payer: Self-pay | Admitting: Allergy & Immunology

## 2023-04-22 ENCOUNTER — Ambulatory Visit: Payer: 59 | Admitting: Allergy & Immunology

## 2023-04-22 DIAGNOSIS — K219 Gastro-esophageal reflux disease without esophagitis: Secondary | ICD-10-CM | POA: Diagnosis not present

## 2023-04-22 DIAGNOSIS — J3089 Other allergic rhinitis: Secondary | ICD-10-CM | POA: Diagnosis not present

## 2023-04-22 DIAGNOSIS — J4540 Moderate persistent asthma, uncomplicated: Secondary | ICD-10-CM

## 2023-04-22 DIAGNOSIS — J302 Other seasonal allergic rhinitis: Secondary | ICD-10-CM | POA: Diagnosis not present

## 2023-04-22 NOTE — Patient Instructions (Addendum)
 1. Moderate persistent asthma, uncomplicated (Primary) - Lung testing not done. - Continue with the Symbicort.  - Spacer use reviewed. - Daily controller medication(s): Symbicort 160/4.25mcg two puffs TWICE DAILY every day - Prior to physical activity: albuterol 2 puffs 10-15 minutes before physical activity. - Rescue medications: albuterol 4 puffs every 4-6 hours as needed and albuterol nebulizer one vial every 4-6 hours as needed - Changes during respiratory infections or worsening symptoms: Increase Symbicort to 2 puffs  four times daily for ONE TO TWO WEEKS. - Asthma control goals:  * Full participation in all desired activities (may need albuterol before activity) * Albuterol use two time or less a week on average (not counting use with activity) * Cough interfering with sleep two time or less a month * Oral steroids no more than once a year * No hospitalizations  2. Seasonal and perennial allergic rhinitis (trees, molds) - We will re-do environmental allergy testing at the next visit.  - Continue with cetirizine 10mg  daily (HOLD for 3 days before the next visit).   3. Concern for food allergies - Testing was positive for [place] and lobster. - Avoid these for now. - Emergency Action Plan provided. - EpiPen training provided.   4. Return in about 1 week (around 04/29/2023), or ALLERGY TESTING (1-55). You can have the follow up appointment with Dr. Dellis Anes or a Nurse Practicioner (our Nurse Practitioners are excellent and always have Physician oversight!).    Please inform us of any Emergency Department visits, hospitalizations, or changes in symptoms. Call us before going to the ED for breathing or allergy symptoms since we might be able to fit you in for a sick visit. Feel free to contact us anytime with any questions, problems, or concerns.  It was a pleasure to meet you today!  Websites that have reliable patient information: 1. American Academy of Asthma, Allergy, and  Immunology: www.aaaai.org 2. Food Allergy Research and Education (FARE): foodallergy.org 3. Mothers of Asthmatics: http://www.asthmacommunitynetwork.org 4. American College of Allergy, Asthma, and Immunology: www.acaai.org      "Like" [place] on Facebook and Instagram for our latest updates!      A healthy democracy works best when [employer] participate! Make sure you are registered to vote! If you have moved or changed any of your contact information, you will need to get this updated before voting! Scan the QR codes below to learn more!       Food Adult Perc - 04/22/23 0900     Time Antigen Placed 5284    Allergen Manufacturer Waynette Buttery    Location Back    Number of allergen test 50     Control-buffer 50% Glycerol Negative    Control-Histamine 2+    1. Peanut Negative    2. Soybean Negative    3. Wheat Negative    4. Sesame Negative    5. Milk, Cow Negative    6. Casein Negative    7. Egg White, Chicken Negative    8. Shellfish Mix Negative    9. Fish Mix Negative    10. Cashew Negative    11. Walnut Food Negative    12. Almond Negative    13. Hazelnut Negative    14. Pecan Food Negative    19. Tuna Negative    20. Salmon Negative    23. Shrimp Negative    24. Crab Negative    25. Lobster --   2 x 4   26. Oyster Negative    27. Scallops  Negative    28. Oat  Negative    29. Rice Negative    30. Barley Negative    31. Rye  Negative    33. [place] Meat --   3 x 5   34. Chicken Meat Negative    35. Pork Negative    36. Beef Negative    37. Lamb Negative    38. Tomato Negative    39. White Potato Negative    46. Mushrooms Negative    47. Onion Negative    48. Avocado Negative    49. Cabbage Negative    50. Carrots Negative    54. Grape (White seedless) Negative    55. Orange  Negative    59. Peach Negative    61. Blueberry Negative    66. Chocolate/Cacao Bean Negative    67. Cinnamon Negative    68. Nutmeg Negative    69. Ginger Negative    70. Garlic  Negative    71. Pepper, Black Negative    72. Mustard Negative

## 2023-04-22 NOTE — Progress Notes (Signed)
 FOLLOW UP  Date of Service/Encounter:  04/22/23   Assessment:   Moderate persistent asthma, uncomplicated   Seasonal and perennial allergic rhinitis - planning for skin testing at the next visit   Concern for food allergies - with reactivity to [place] and lobster today  Plan/Recommendations:   1. Moderate persistent asthma, uncomplicated (Primary) - Lung testing not done. - Continue with the Symbicort.  - Spacer use reviewed. - Daily controller medication(s): Symbicort 160/4.63mcg two puffs TWICE DAILY every day - Prior to physical activity: albuterol 2 puffs 10-15 minutes before physical activity. - Rescue medications: albuterol 4 puffs every 4-6 hours as needed and albuterol nebulizer one vial every 4-6 hours as needed - Changes during respiratory infections or worsening symptoms: Increase Symbicort to 2 puffs  four times daily for ONE TO TWO WEEKS. - Asthma control goals:  * Full participation in all desired activities (may need albuterol before activity) * Albuterol use two time or less a week on average (not counting use with activity) * Cough interfering with sleep two time or less a month * Oral steroids no more than once a year * No hospitalizations  2. Seasonal and perennial allergic rhinitis (trees, molds) - We will re-do environmental allergy testing at the next visit.  - Continue with cetirizine 10mg  daily (HOLD for 3 days before the next visit).   3. Concern for food allergies - Testing was positive for [place] and lobster. - Avoid these for now. - Emergency Action Plan provided. - EpiPen training provided.   4. Return in about 1 week (around 04/29/2023), or ALLERGY TESTING (1-55). You can have the follow up appointment with Dr. Dellis Anes or a Nurse Practicioner (our Nurse Practitioners are excellent and always have Physician oversight!).    Subjective:   Kimberly Wells is a 48 y.o. female presenting today for follow up of No chief complaint on  file.   Kimberly Wells has a history of the following: Patient Active Problem List   Diagnosis Date Noted   Intractable nausea and vomiting 04/05/2023   Depression 12/19/2012    History obtained from: chart review and patient.  Discussed the use of AI scribe software for clinical note transcription with the patient and/or guardian, who gave verbal consent to proceed.  Kimberly Wells is a 48 y.o. female presenting for skin testing. She was last seen on February 27th. We could not do testing because her insurance company does not cover testing on the same day as a New Patient visit. She has been off of all antihistamines 3 days in anticipation of the testing.   At the last visit, her lung testing looked great.  We recommended going back on the Symbicort 2 puffs twice daily.  We decided to do environmental allergy testing at this visit.  She was worried about food allergies as well.  Otherwise, there have been no changes to her past medical history, surgical history, family history, or social history.    Review of systems otherwise negative other than that mentioned in the HPI.    Objective:   There were no vitals taken for this visit. There is no height or weight on file to calculate BMI.    Physical exam deferred since this was a skin testing appointment only.   Diagnostic studies:   Allergy Studies:     Food Adult Perc - 04/22/23 0900     Time Antigen Placed 8469    Allergen Manufacturer Waynette Buttery    Location Back    Number  of allergen test 50     Control-buffer 50% Glycerol Negative    Control-Histamine 2+    1. Peanut Negative    2. Soybean Negative    3. Wheat Negative    4. Sesame Negative    5. Milk, Cow Negative    6. Casein Negative    7. Egg White, Chicken Negative    8. Shellfish Mix Negative    9. Fish Mix Negative    10. Cashew Negative    11. Walnut Food Negative    12. Almond Negative    13. Hazelnut Negative    14. Pecan Food Negative    19. Tuna  Negative    20. Salmon Negative    23. Shrimp Negative    24. Crab Negative    25. Lobster --   2 x 4   26. Oyster Negative    27. Scallops Negative    28. Oat  Negative    29. Rice Negative    30. Barley Negative    31. Rye  Negative    33. [place] Meat --   3 x 5   34. Chicken Meat Negative    35. Pork Negative    36. Beef Negative    37. Lamb Negative    38. Tomato Negative    39. White Potato Negative    46. Mushrooms Negative    47. Onion Negative    48. Avocado Negative    49. Cabbage Negative    50. Carrots Negative    54. Grape (White seedless) Negative    55. Orange  Negative    59. Peach Negative    61. Blueberry Negative    66. Chocolate/Cacao Bean Negative    67. Cinnamon Negative    68. Nutmeg Negative    69. Ginger Negative    70. Garlic Negative    71. Pepper, Black Negative    72. Mustard Negative             Allergy testing results were read and interpreted by myself, documented by clinical staff.      Malachi Bonds, MD  [facility]

## 2023-05-06 ENCOUNTER — Ambulatory Visit: Admitting: Allergy & Immunology

## 2023-05-20 ENCOUNTER — Ambulatory Visit: Admitting: Allergy & Immunology

## 2023-05-25 ENCOUNTER — Ambulatory Visit: Admitting: Allergy & Immunology

## 2023-05-25 DIAGNOSIS — J309 Allergic rhinitis, unspecified: Secondary | ICD-10-CM

## 2023-08-23 IMAGING — CT CT HEAD W/O CM
4 series · 17 of 47 positions shown, 19 images · non-contrast
Comparison: None.

CLINICAL DATA: Headache and left leg pain.



[Series 3: head wo · axial · 0.46mm/px · z∈[-113,-8]mm · 7 of 29 slices shown, 9 images]
[im 4/29  brain]
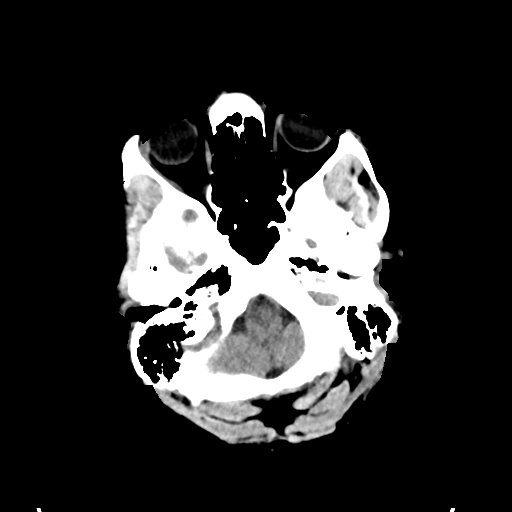
[im 4/29  bone]
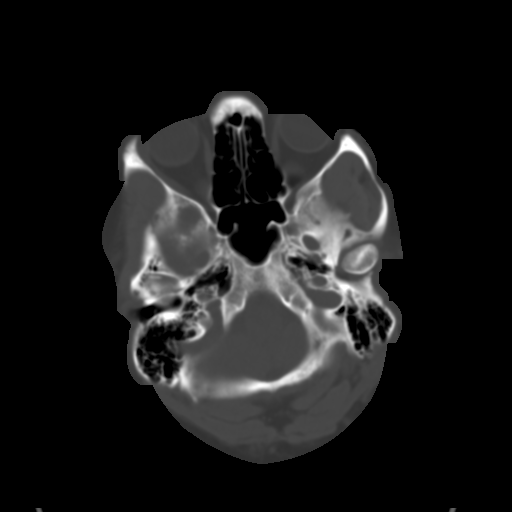
[im 8/29  brain]
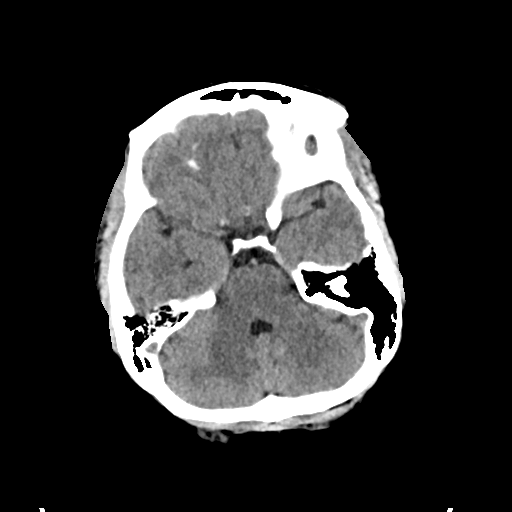
[im 11/29  brain]
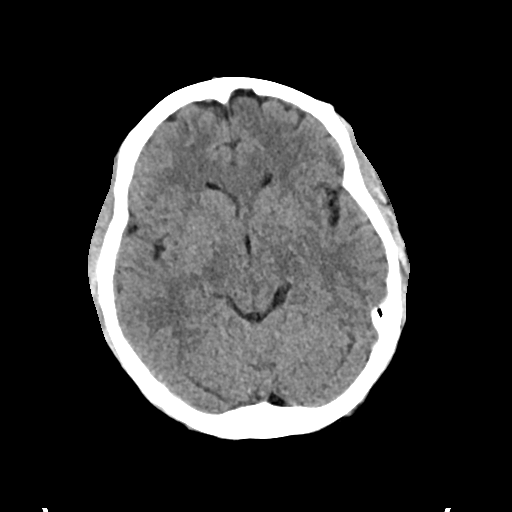
[im 15/29  brain]
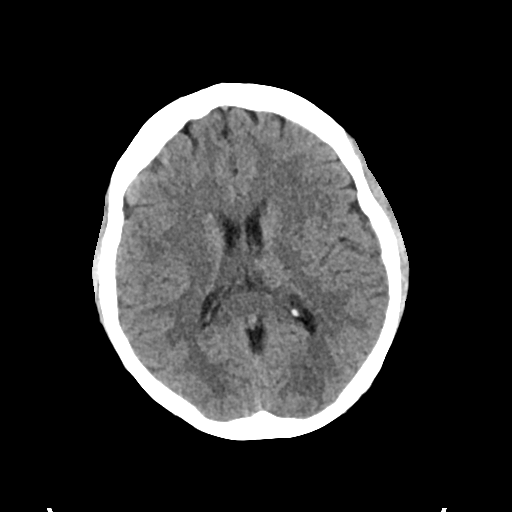
[im 18/29  brain]
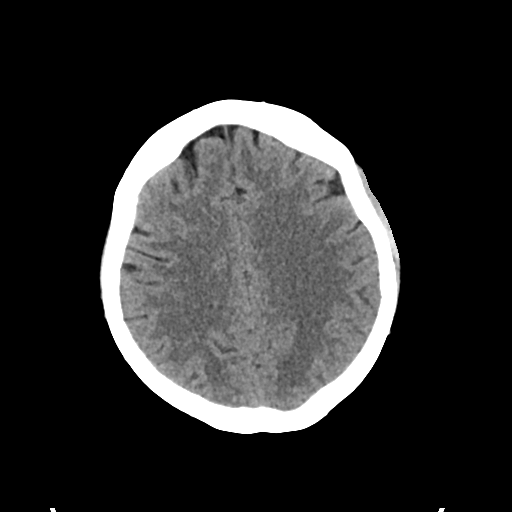
[im 18/29  bone]
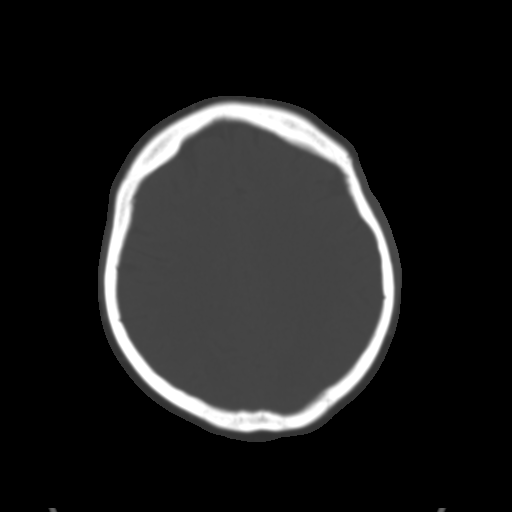
[im 22/29  brain]
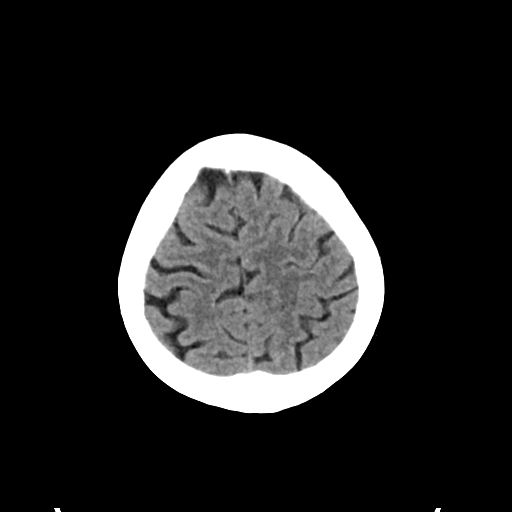
[im 25/29  brain]
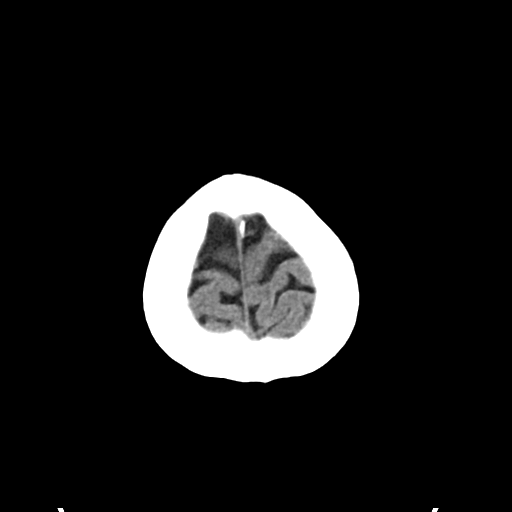

[Series 4: head bone · axial · 0.46mm/px · z∈[-114,-64]mm · 4 of 73 slices shown]
[im 8/73  bone]
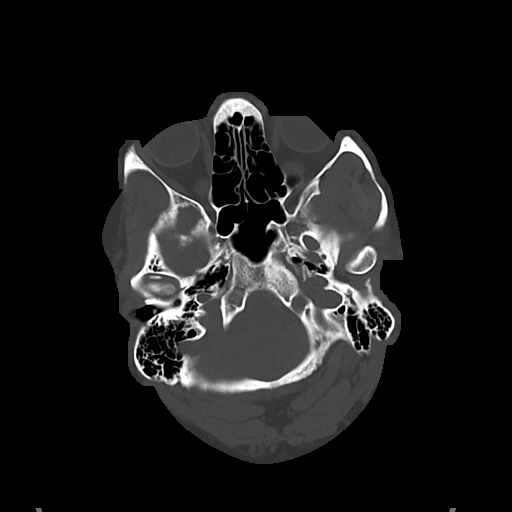
[im 15/73  bone]
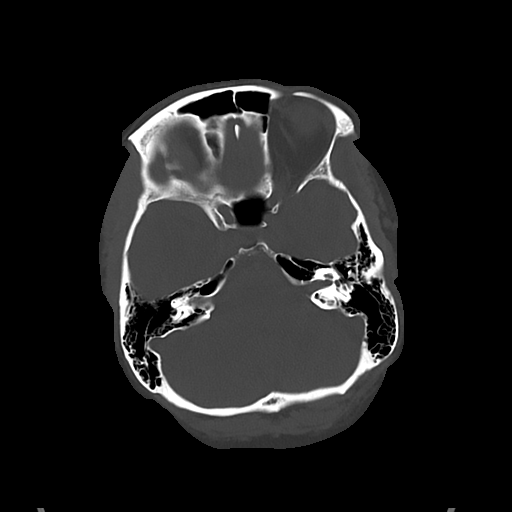
[im 22/73  bone]
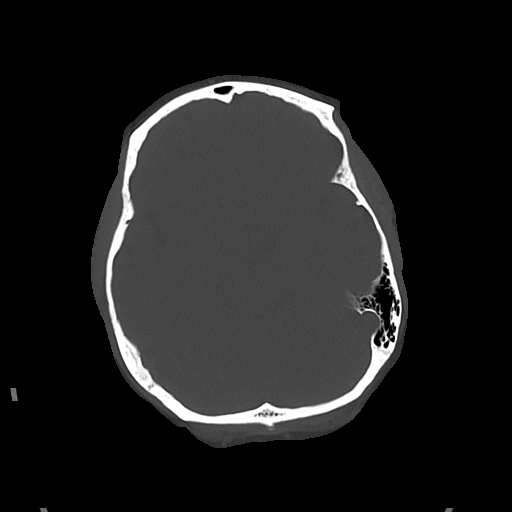
[im 33/73  bone]
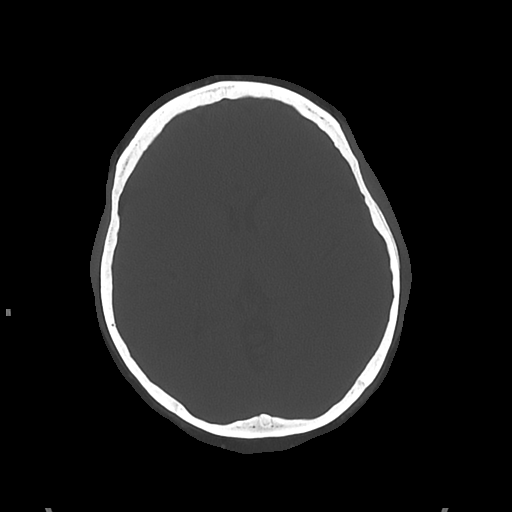

[Series 5: cor soft · coronal · 0.32mm/px · 3 of 63 slices shown]
[im 21/63  brain]
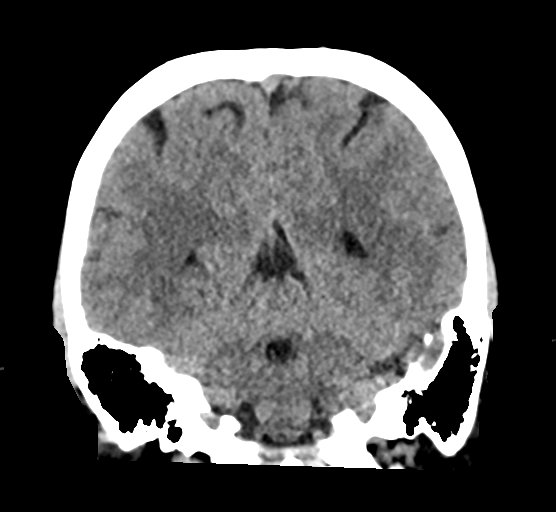
[im 28/63  brain]
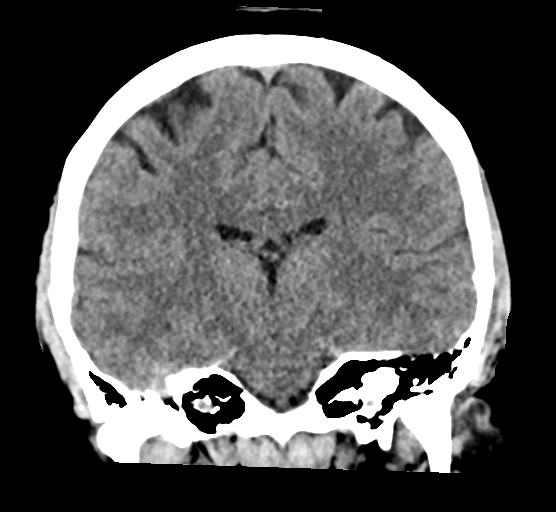
[im 35/63  brain]
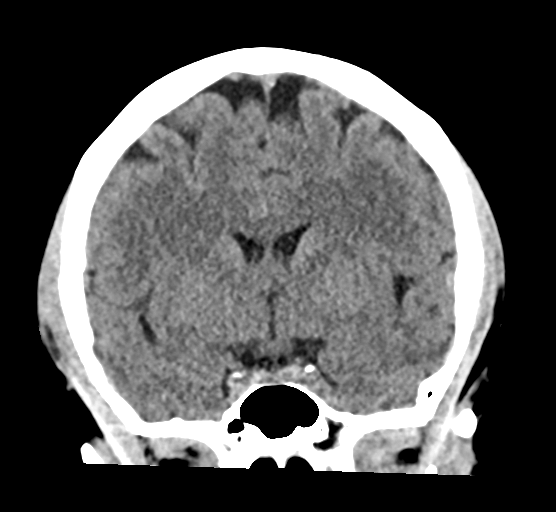

[Series 6: sag soft · sagittal · 0.32mm/px · 3 of 59 slices shown]
[im 20/59  brain]
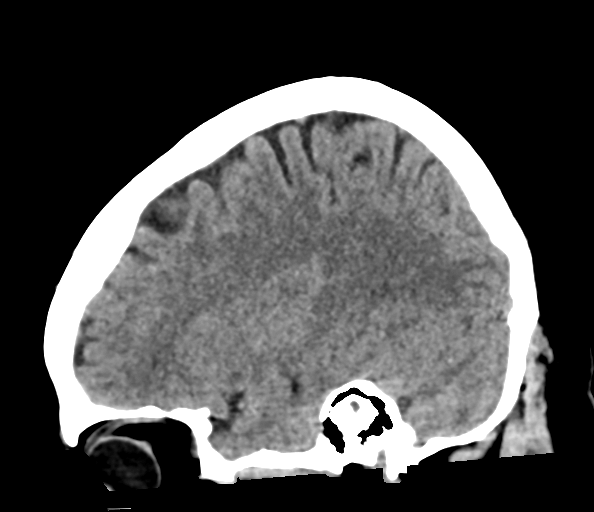
[im 30/59  brain]
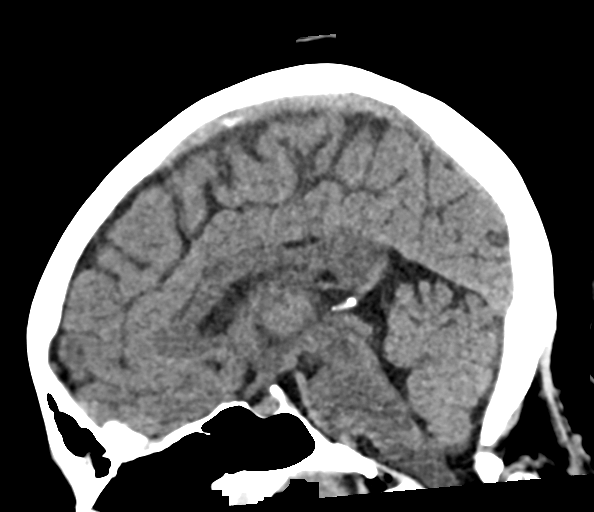
[im 39/59  brain]
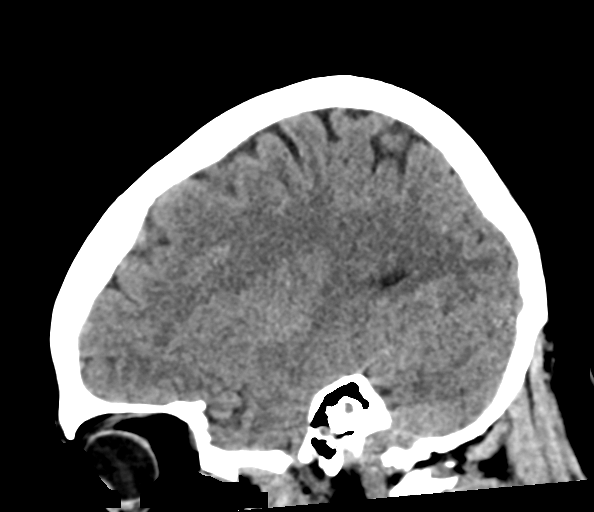

[17 of 47 positions shown; findings below may reference images not displayed]

FINDINGS: Brain: No evidence of acute infarction, hemorrhage, hydrocephalus,
extra-axial collection or mass lesion/mass effect.

Vascular: No hyperdense vessel or unexpected calcification.

Skull: Normal. Negative for fracture or focal lesion.

Sinuses/Orbits: No acute finding.

Other: None.
IMPRESSION: No acute intracranial pathology.

## 2023-12-20 ENCOUNTER — Encounter: Payer: Self-pay | Admitting: Radiology
